# Patient Record
Sex: Female | Born: 1955 | Race: Black or African American | Hispanic: No | Smoking: Never smoker
Health system: Southern US, Community
[De-identification: ages and names within clinical notes are randomized; demographics above are authoritative.]

## PROBLEM LIST (undated history)

## (undated) DIAGNOSIS — N390 Urinary tract infection, site not specified: Secondary | ICD-10-CM

## (undated) DIAGNOSIS — E079 Disorder of thyroid, unspecified: Secondary | ICD-10-CM

## (undated) DIAGNOSIS — E785 Hyperlipidemia, unspecified: Secondary | ICD-10-CM

## (undated) DIAGNOSIS — D649 Anemia, unspecified: Secondary | ICD-10-CM

## (undated) DIAGNOSIS — K8020 Calculus of gallbladder without cholecystitis without obstruction: Secondary | ICD-10-CM

## (undated) DIAGNOSIS — I10 Essential (primary) hypertension: Secondary | ICD-10-CM

## (undated) DIAGNOSIS — E039 Hypothyroidism, unspecified: Secondary | ICD-10-CM

## (undated) DIAGNOSIS — Z87442 Personal history of urinary calculi: Secondary | ICD-10-CM

## (undated) HISTORY — DX: Anemia, unspecified: D64.9

## (undated) HISTORY — DX: Hyperlipidemia, unspecified: E78.5

## (undated) HISTORY — PX: OTHER SURGICAL HISTORY: SHX169

## (undated) HISTORY — DX: Personal history of urinary calculi: Z87.442

## (undated) HISTORY — DX: Disorder of thyroid, unspecified: E07.9

## (undated) HISTORY — DX: Calculus of gallbladder without cholecystitis without obstruction: K80.20

---

## 1999-07-12 ENCOUNTER — Other Ambulatory Visit: Admission: RE | Admit: 1999-07-12 | Discharge: 1999-07-12 | Payer: Self-pay | Admitting: Obstetrics and Gynecology

## 2000-08-02 ENCOUNTER — Other Ambulatory Visit: Admission: RE | Admit: 2000-08-02 | Discharge: 2000-08-02 | Payer: Self-pay | Admitting: Obstetrics and Gynecology

## 2001-09-23 ENCOUNTER — Other Ambulatory Visit: Admission: RE | Admit: 2001-09-23 | Discharge: 2001-09-23 | Payer: Self-pay | Admitting: Obstetrics and Gynecology

## 2002-03-09 ENCOUNTER — Emergency Department (HOSPITAL_COMMUNITY): Admission: EM | Admit: 2002-03-09 | Discharge: 2002-03-09 | Payer: Self-pay | Admitting: *Deleted

## 2002-08-06 ENCOUNTER — Encounter: Payer: Self-pay | Admitting: Internal Medicine

## 2002-08-06 ENCOUNTER — Ambulatory Visit (HOSPITAL_COMMUNITY): Admission: RE | Admit: 2002-08-06 | Discharge: 2002-08-06 | Payer: Self-pay | Admitting: Internal Medicine

## 2002-10-08 ENCOUNTER — Other Ambulatory Visit: Admission: RE | Admit: 2002-10-08 | Discharge: 2002-10-08 | Payer: Self-pay | Admitting: Obstetrics and Gynecology

## 2002-12-08 ENCOUNTER — Encounter: Payer: Self-pay | Admitting: Internal Medicine

## 2002-12-08 ENCOUNTER — Ambulatory Visit (HOSPITAL_COMMUNITY): Admission: RE | Admit: 2002-12-08 | Discharge: 2002-12-08 | Payer: Self-pay | Admitting: Internal Medicine

## 2006-09-02 ENCOUNTER — Other Ambulatory Visit: Admission: RE | Admit: 2006-09-02 | Discharge: 2006-09-02 | Payer: Self-pay | Admitting: Gynecology

## 2007-04-21 ENCOUNTER — Emergency Department (HOSPITAL_COMMUNITY): Admission: EM | Admit: 2007-04-21 | Discharge: 2007-04-21 | Payer: Self-pay | Admitting: Emergency Medicine

## 2007-09-02 ENCOUNTER — Emergency Department (HOSPITAL_COMMUNITY): Admission: EM | Admit: 2007-09-02 | Discharge: 2007-09-02 | Payer: Self-pay | Admitting: Family Medicine

## 2007-10-14 ENCOUNTER — Emergency Department (HOSPITAL_COMMUNITY): Admission: EM | Admit: 2007-10-14 | Discharge: 2007-10-14 | Payer: Self-pay | Admitting: Emergency Medicine

## 2007-11-03 ENCOUNTER — Ambulatory Visit (HOSPITAL_COMMUNITY): Admission: RE | Admit: 2007-11-03 | Discharge: 2007-11-03 | Payer: Self-pay | Admitting: Internal Medicine

## 2008-03-17 ENCOUNTER — Emergency Department (HOSPITAL_COMMUNITY): Admission: EM | Admit: 2008-03-17 | Discharge: 2008-03-17 | Payer: Self-pay | Admitting: Emergency Medicine

## 2011-01-25 LAB — HM PAP SMEAR: HM Pap smear: NORMAL

## 2011-10-10 ENCOUNTER — Emergency Department (HOSPITAL_COMMUNITY)
Admission: EM | Admit: 2011-10-10 | Discharge: 2011-10-10 | Disposition: A | Discharge status: Home / Self Care | Payer: Self-pay | Attending: Emergency Medicine | Admitting: Emergency Medicine

## 2011-10-10 ENCOUNTER — Encounter (HOSPITAL_COMMUNITY): Payer: Self-pay | Admitting: *Deleted

## 2011-10-10 DIAGNOSIS — R300 Dysuria: Secondary | ICD-10-CM

## 2011-10-10 HISTORY — DX: Urinary tract infection, site not specified: N39.0

## 2011-10-10 LAB — URINALYSIS, ROUTINE W REFLEX MICROSCOPIC
Bilirubin Urine: NEGATIVE
Glucose, UA: NEGATIVE mg/dL
Hgb urine dipstick: NEGATIVE
Ketones, ur: NEGATIVE mg/dL
Nitrite: NEGATIVE
Protein, ur: NEGATIVE mg/dL
Specific Gravity, Urine: 1.022 (ref 1.005–1.030)
Urobilinogen, UA: 0.2 mg/dL (ref 0.0–1.0)
pH: 5.5 (ref 5.0–8.0)

## 2011-10-10 LAB — URINE MICROSCOPIC-ADD ON

## 2011-10-10 MED ORDER — PHENAZOPYRIDINE HCL 100 MG PO TABS
200.0000 mg | ORAL_TABLET | Freq: Once | ORAL | Status: AC
  Administered 2011-10-10: 200 mg via ORAL
  Filled 2011-10-10: qty 2

## 2011-10-10 MED ORDER — CIPROFLOXACIN HCL 500 MG PO TABS: 500.0000 mg | ORAL_TABLET | Freq: Two times a day (BID) | ORAL | Status: AC

## 2011-10-10 MED ORDER — PHENAZOPYRIDINE HCL 200 MG PO TABS: 200.0000 mg | ORAL_TABLET | Freq: Three times a day (TID) | ORAL | Status: AC | PRN

## 2011-10-10 NOTE — ED Provider Notes (Signed)
History   This chart was scribed for Marisa Robinson. Oletta Lamas, MD by Clarita Crane. The patient was seen in room STRE2/STRE2. Patient's care was started at 1215.    CSN: [id number]  Arrival date & time 10/10/11  1215   First MD Initiated Contact with Patient 10/10/11 1231      Chief Complaint  Patient presents with  . Dysuria    (Consider location/radiation/quality/duration/timing/severity/associated sxs/prior treatment) HPI Marisa Robinson is a 56 y.o. female who presents to the Emergency Department complaining of multiple episodes of moderate dysuria onset 3 days ago and gradually worsening since with associated urinary frequency. Patient states symptoms are similar to previous UTIs experienced. Denies abdominal pain, fever, vaginal bleeding or discharge, chills, back pain.  Past Medical History  Diagnosis Date  . Urinary tract infection     History reviewed. No pertinent past surgical history.  History reviewed. No pertinent family history.  History  Substance Use Topics  . Smoking status: Never Smoker   . Smokeless tobacco: Not on file  . Alcohol Use: No    OB History    Grav Para Term Preterm Abortions TAB SAB Ect Mult Living                  Review of Systems  Constitutional: Negative for fever, chills and appetite change.  Respiratory: Negative for shortness of breath.   Gastrointestinal: Negative for nausea, vomiting and abdominal pain.  Genitourinary: Positive for dysuria and frequency. Negative for flank pain, vaginal bleeding and vaginal discharge.  Musculoskeletal: Negative for back pain.  Skin: Negative for rash.  Neurological: Negative for weakness.    Allergies  Review of patient's allergies indicates no known allergies.  Home Medications   Current Outpatient Rx  Name Route Sig Dispense Refill  . CIPROFLOXACIN HCL 500 MG PO TABS Oral Take 1 tablet (500 mg total) by mouth 2 (two) times daily. 10 tablet 0  . PHENAZOPYRIDINE HCL 200 MG PO TABS Oral Take 1  tablet (200 mg total) by mouth 3 (three) times daily as needed for pain. 6 tablet 0    BP 157/82  Pulse 71  Temp(Src) 98.1 F (36.7 C) (Oral)  Resp 16  SpO2 100%  Physical Exam  Nursing note and vitals reviewed. Constitutional: She is oriented to person, place, and time. She appears well-developed and well-nourished. No distress.  HENT:  Head: Normocephalic and atraumatic.  Eyes: EOM are normal. No scleral icterus.  Neck: No tracheal deviation present.  Cardiovascular: Normal rate.   Pulmonary/Chest: Effort normal. No respiratory distress.  Abdominal: Soft. She exhibits no distension. There is no tenderness. There is no rebound and no guarding.  Musculoskeletal: Normal range of motion.  Neurological: She is alert and oriented to person, place, and time.  Skin: Skin is warm and dry. No rash noted.  Psychiatric: She has a normal mood and affect. Her behavior is normal.    ED Course  Procedures (including critical care time)  DIAGNOSTIC STUDIES: Oxygen Saturation is 100% on room air, normal by my interpretation.    COORDINATION OF CARE: 12:43PM-Patient informed of current plan for treatment and evaluation and agrees with plan at this time.     Labs Reviewed  URINALYSIS, ROUTINE W REFLEX MICROSCOPIC - Abnormal; Notable for the following:    APPearance CLOUDY (*)    Leukocytes, UA SMALL (*)    All other components within normal limits  URINE MICROSCOPIC-ADD ON - Abnormal; Notable for the following:    Bacteria, UA FEW (*)  Casts HYALINE CASTS (*)    All other components within normal limits  URINE CULTURE   No results found.   1. Dysuria     1:30 PM Few bacteria with only 3-6 WBC's, but small LE suggestive of UTI, but not definitive.  Culture sent, will treat with abx given symptoms, but pt urged to follow up with PCP closely if symptoms are not improved in the next 3-5 days.    MDM  I personally performed the services described in this documentation, which was  scribed in my presence. The recorded information has been reviewed and considered.   Not toxic, symptoms of UTI with urinary freq, urgency, dysuria.  No vaginal symptoms, no abd pain, fever, vomiting.  Denies back pain.  Pyridium given.  UA sent.  Will give fluoroquinolone     Marisa Robinson. Kashonda Sarkisyan, MD 10/10/11 1334

## 2011-10-10 NOTE — Discharge Instructions (Signed)

## 2011-10-10 NOTE — ED Notes (Signed)
Pt reports pain with urination, started on Sunday. No acute distress noted at triage.

## 2012-12-03 ENCOUNTER — Encounter (HOSPITAL_COMMUNITY): Payer: Self-pay | Admitting: Emergency Medicine

## 2012-12-03 ENCOUNTER — Emergency Department (HOSPITAL_COMMUNITY)
Admission: EM | Admit: 2012-12-03 | Discharge: 2012-12-03 | Disposition: A | Discharge status: Home / Self Care | Payer: No Typology Code available for payment source | Attending: Emergency Medicine | Admitting: Emergency Medicine

## 2012-12-03 ENCOUNTER — Emergency Department (HOSPITAL_COMMUNITY): Payer: No Typology Code available for payment source

## 2012-12-03 DIAGNOSIS — Z79899 Other long term (current) drug therapy: Secondary | ICD-10-CM | POA: Insufficient documentation

## 2012-12-03 DIAGNOSIS — R112 Nausea with vomiting, unspecified: Secondary | ICD-10-CM | POA: Insufficient documentation

## 2012-12-03 DIAGNOSIS — D649 Anemia, unspecified: Secondary | ICD-10-CM | POA: Insufficient documentation

## 2012-12-03 DIAGNOSIS — Z87440 Personal history of urinary (tract) infections: Secondary | ICD-10-CM | POA: Insufficient documentation

## 2012-12-03 DIAGNOSIS — K8020 Calculus of gallbladder without cholecystitis without obstruction: Secondary | ICD-10-CM | POA: Insufficient documentation

## 2012-12-03 LAB — LIPASE, BLOOD: Lipase: 47 U/L (ref 11–59)

## 2012-12-03 LAB — CBC WITH DIFFERENTIAL/PLATELET
Basophils Absolute: 0.1 10*3/uL (ref 0.0–0.1)
Basophils Relative: 1 % (ref 0–1)
Eosinophils Absolute: 0.2 10*3/uL (ref 0.0–0.7)
Hemoglobin: 11 g/dL — ABNORMAL LOW (ref 12.0–15.0)
Lymphocytes Relative: 30 % (ref 12–46)
MCHC: 32.6 g/dL (ref 30.0–36.0)
Neutrophils Relative %: 63 % (ref 43–77)
RDW: 14.6 % (ref 11.5–15.5)

## 2012-12-03 LAB — COMPREHENSIVE METABOLIC PANEL
Alkaline Phosphatase: 72 U/L (ref 39–117)
BUN: 19 mg/dL (ref 6–23)
Creatinine, Ser: 1.08 mg/dL (ref 0.50–1.10)
GFR calc Af Amer: 64 mL/min — ABNORMAL LOW (ref >90)
Glucose, Bld: 105 mg/dL — ABNORMAL HIGH (ref 70–99)
Potassium: 3.8 mEq/L (ref 3.5–5.1)
Total Protein: 7.7 g/dL (ref 6.0–8.3)

## 2012-12-03 LAB — URINALYSIS, ROUTINE W REFLEX MICROSCOPIC
Bilirubin Urine: NEGATIVE
Hgb urine dipstick: NEGATIVE
Ketones, ur: NEGATIVE mg/dL
Protein, ur: NEGATIVE mg/dL
Specific Gravity, Urine: 1.016 (ref 1.005–1.030)
Urobilinogen, UA: 0.2 mg/dL (ref 0.0–1.0)

## 2012-12-03 MED ORDER — ONDANSETRON HCL 4 MG/2ML IJ SOLN
4.0000 mg | Freq: Once | INTRAMUSCULAR | Status: AC
  Administered 2012-12-03: 4 mg via INTRAVENOUS
  Filled 2012-12-03: qty 2

## 2012-12-03 MED ORDER — ONDANSETRON HCL 4 MG PO TABS: 4.0000 mg | ORAL_TABLET | Freq: Four times a day (QID) | ORAL | Status: DC

## 2012-12-03 MED ORDER — MORPHINE SULFATE 4 MG/ML IJ SOLN
4.0000 mg | Freq: Once | INTRAMUSCULAR | Status: AC | PRN
  Administered 2012-12-03: 4 mg via INTRAVENOUS
  Filled 2012-12-03 (×2): qty 1

## 2012-12-03 MED ORDER — HYDROCODONE-ACETAMINOPHEN 5-325 MG PO TABS: 1.0000 | ORAL_TABLET | Freq: Three times a day (TID) | ORAL | Status: DC | PRN

## 2012-12-03 MED ORDER — MORPHINE SULFATE 4 MG/ML IJ SOLN
4.0000 mg | Freq: Once | INTRAMUSCULAR | Status: AC
  Administered 2012-12-03: 4 mg via INTRAVENOUS
  Filled 2012-12-03: qty 1

## 2012-12-03 MED ORDER — SODIUM CHLORIDE 0.9 % IV BOLUS (SEPSIS)
1000.0000 mL | Freq: Once | INTRAVENOUS | Status: AC
  Administered 2012-12-03: 1000 mL via INTRAVENOUS

## 2012-12-03 NOTE — ED Notes (Signed)
Pt has a urine cup and knows we need a urine sample 

## 2012-12-03 NOTE — ED Provider Notes (Signed)
History    CSN: [id number] Arrival date & time 12/03/12  5366  First MD Initiated Contact with Patient 12/03/12 [phone]     Chief Complaint  Patient presents with  . Flank Pain   (Consider location/radiation/quality/duration/timing/severity/associated sxs/prior Treatment) HPI  Marisa Robinson is a 57 y.o.female with a significant PMH of UTI presents to the ER with complaints of right flank pain that started 3 days ago and has been worsening with time. She has also had vomiting. The pain is worse in the morning, and continues throughout the day but is less severe. She denies having any lower abdominal pain, fever, vaginal discharge hematuria, injury. Denies history of intraabdominal surgery or history of abdominal pains.    Past Medical History  Diagnosis Date  . Urinary tract infection    History reviewed. No pertinent past surgical history. No family history on file. History  Substance Use Topics  . Smoking status: Never Smoker   . Smokeless tobacco: Not on file  . Alcohol Use: No   OB History   Grav Para Term Preterm Abortions TAB SAB Ect Mult Living                 Review of Systems  Gastrointestinal: Positive for nausea and vomiting.  Genitourinary: Positive for flank pain.  All other systems reviewed and are negative.    Allergies  Review of patient's allergies indicates no known allergies.  Home Medications   Current Outpatient Rx  Name  Route  Sig  Dispense  Refill  . Multiple Vitamin (MULTIVITAMIN WITH MINERALS) TABS   Oral   Take 1 tablet by mouth daily.         . Probiotic Product (PROBIOTIC FORMULA PO)   Oral   Take 1 capsule by mouth daily. 10X by [facility]          BP 161/86  Pulse 63  Temp(Src) 98.6 F (37 C) (Oral)  Resp 18  SpO2 96% Physical Exam  Nursing note and vitals reviewed. Constitutional: She appears well-developed and well-nourished. No distress.  HENT:  Head: Normocephalic and atraumatic.  Eyes: Pupils are equal,  round, and reactive to light.  Neck: Normal range of motion. Neck supple.  Cardiovascular: Normal rate and regular rhythm.   Pulmonary/Chest: Effort normal.  Abdominal: Soft. Bowel sounds are normal. There is tenderness. There is CVA tenderness. There is no rigidity, no guarding, no tenderness at McBurney's point and negative Murphy's sign.  Neurological: She is alert.  Skin: Skin is warm and dry.    ED Course  Procedures (including critical care time) Labs Reviewed  COMPREHENSIVE METABOLIC PANEL - Abnormal; Notable for the following:    Glucose, Bld 105 (*)    Total Bilirubin 0.2 (*)    GFR calc non Af Amer 55 (*)    GFR calc Af Amer 64 (*)    All other components within normal limits  CBC WITH DIFFERENTIAL - Abnormal; Notable for the following:    Hemoglobin 11.0 (*)    HCT 33.7 (*)    MCV 74.2 (*)    MCH 24.2 (*)    All other components within normal limits  URINALYSIS, ROUTINE W REFLEX MICROSCOPIC  LIPASE, BLOOD   No results found. No diagnosis found.  MDM  Patient has required another dose of pain medication and pain has localized RUQ. Labs are benign. Will order abdominal ultrasound to evaluate right upper quadrant.   End of shift care handed over to [place], PA-C at end of shift.  Dorthula Matas, PA-C 12/03/12 1304

## 2012-12-03 NOTE — ED Provider Notes (Signed)
Medical screening examination/treatment/procedure(s) were performed by non-physician practitioner and as supervising physician I was immediately available for consultation/collaboration.   Gwyneth Sprout, MD 12/03/12 1538

## 2012-12-03 NOTE — ED Provider Notes (Signed)
Tranfer of care from Marlon Pel, PA-C at 1:30PM change of shifts.  Marisa Robinson is a 57 y/o F presenting to the ED with right flank pain and RUQ pain that has been ongoing for the past 3 days with intermittent vomiting at least 2-3 times per day, mainly of food contents. Denied nausea, diarrhea, changes to appetite, fever, chills, headache. Denied history of kidney stones and gallstones.   UA negative findings - negative calcium oxalate crystals. CMP negative findings. Lipase negative elevation. CBC mildly low Hgb (11.0) and Hct (33.7) .  Abdominal US ordered - ruling out gallstones and kidney stones.  Abdominal US - negative kidney stones - cholelithiasis with gallstones - negative cholecystitis findings.   Negative elevation in WBC, patient afebrile, gallstones noted in Abdominal US without gallbladder wall thickening and pericholecystic fluid - doubt cholecystitis at the moment.  Patient re-assessed at 3:00PM - patient reported that pain is controlled, negative pain noted upon palpation of the abdomen. Abdomen soft, non-distended, non-tender - negative Murphy's sign noted. Patient stable afebrile. Cholelithiasis with gallstones, negative cholecystitis noted at the time. Discharged patient with pain medications and anti-emetics - discussed course, precautions and technique while on pain medications. Discharged patient with referral to general surgery and adult care clinic. Discussed with patient to get Hgb, Hct, glucose, and cholesterol levels checked when at the clinic. Discussed with patients symptoms to watch out for regarding cholecystitis. Discussed with patient diet. Discussed with patient to continue to monitor symptoms and if symptoms are to worsen or change to report back to the ED - strict return instructions given.  Patient agreed to plan of care, understood, all questions answered.     Raymon Mutton, PA-C 12/03/12 1630

## 2012-12-03 NOTE — ED Notes (Signed)
PT. REPORTS RIGHT FLANK PAIN WITH EMESIS ONSET 3 DAYS AGO , DENIES DYSURIA OR HEMATURIA , NO INJURY , AMBULATORY.

## 2012-12-04 NOTE — ED Provider Notes (Signed)
Medical screening examination/treatment/procedure(s) were performed by non-physician practitioner and as supervising physician I was immediately available for consultation/collaboration.   Gwyneth Sprout, MD 12/04/12 1530

## 2012-12-11 ENCOUNTER — Ambulatory Visit (INDEPENDENT_AMBULATORY_CARE_PROVIDER_SITE_OTHER): Payer: No Typology Code available for payment source | Admitting: Surgery

## 2012-12-11 ENCOUNTER — Telehealth (INDEPENDENT_AMBULATORY_CARE_PROVIDER_SITE_OTHER): Payer: Self-pay | Admitting: Surgery

## 2012-12-11 ENCOUNTER — Encounter (INDEPENDENT_AMBULATORY_CARE_PROVIDER_SITE_OTHER): Payer: Self-pay | Admitting: Surgery

## 2012-12-11 VITALS — BP 120/80 | HR 62 | Temp 98.8°F | Resp 14 | Ht 67.0 in | Wt 217.0 lb

## 2012-12-11 DIAGNOSIS — K8020 Calculus of gallbladder without cholecystitis without obstruction: Secondary | ICD-10-CM | POA: Insufficient documentation

## 2012-12-11 DIAGNOSIS — K8010 Calculus of gallbladder with chronic cholecystitis without obstruction: Secondary | ICD-10-CM

## 2012-12-11 NOTE — Telephone Encounter (Signed)
Patient advised of financial responsibility for surgery, per patient will call back to schedule

## 2012-12-11 NOTE — Patient Instructions (Signed)
  [facility]  LAPAROSCOPIC SURGERY - POST-OP INSTRUCTIONS  Always review your discharge instruction sheet given to you by the facility where your surgery was performed.  A prescription for pain medication may be given to you upon discharge.  Take your pain medication as prescribed.  If narcotic pain medicine is not needed, then you may take acetaminophen (Tylenol) or ibuprofen (Advil) as needed.  Take your usually prescribed medications unless otherwise directed.  If you need a refill on your pain medication, please contact your pharmacy.  They will contact our office to request authorization. Prescriptions will not be filled after 5 P.M. or on weekends.  You should follow a light diet the first few days after arrival home, such as soup and crackers or toast.  Be sure to include plenty of fluids daily.  Most patients will experience some swelling and bruising in the area of the incisions.  Ice packs will help.  Swelling and bruising can take several days to resolve.   It is common to experience some constipation if taking pain medication after surgery.  Increasing fluid intake and taking a stool softener (such as Colace) will usually help or prevent this problem from occurring.  A mild laxative (Milk of Magnesia or Miralax) should be taken according to package instructions if there are no bowel movements after 48 hours.  Unless discharge instructions indicate otherwise, you may remove your bandages 24-48 hours after surgery, and you may shower at that time.  You may have steri-strips (small skin tapes) in place directly over the incision.  These strips should be left on the skin for 7-10 days.  If your surgeon used skin glue on the incision, you may shower in 24 hours.  The glue will flake off over the next 2-3 weeks.  Any sutures or staples will be removed at the office during your follow-up visit.  ACTIVITIES:  You may resume regular (light) daily activities beginning the  next day-such as daily self-care, walking, climbing stairs-gradually increasing activities as tolerated.  You may have sexual intercourse when it is comfortable.  Refrain from any heavy lifting or straining until approved by your doctor.  You may drive when you are no longer taking prescription pain medication, you can comfortably wear a seatbelt, and you can safely maneuver your car and apply brakes.  You should see your doctor in the office for a follow-up appointment approximately 2-3 weeks after your surgery.  Make sure that you call for this appointment within a day or two after you arrive home to insure a convenient appointment time.  WHEN TO CALL YOUR DOCTOR: 1. Fever over 101.0 2. Inability to urinate 3. Continued bleeding from incision 4. Increased pain, redness, or drainage from the incision 5. Increasing abdominal pain  The clinic staff is available to answer your questions during regular business hours.  Please don't hesitate to call and ask to speak to one of the nurses for clinical concerns.  If you have a medical emergency, go to the nearest emergency room or call 911.  A surgeon from [facility] is always on call for the hospital.  Sydne Krahl M. Keziah Drotar, MD, FACS [facility] Office: [phone] Toll Free:  [phone] FAX [phone]  Web site: [email or web address] 

## 2012-12-11 NOTE — Progress Notes (Signed)
General Surgery [facility]  Chief Complaint  Patient presents with  . New Evaluation    evaluate for cholecystectomy - referral from Dr. Gwyneth Sprout, [facility]   HISTORY: Patient is a 57 year old female referred from the emergency department with a diagnosis of symptomatic cholelithiasis. Patient was seen in the emergency department for abdominal pain radiating to the right flank. This was associated with nausea and vomiting. Laboratory studies show normal white blood cell count. Patient underwent abdominal ultrasound which confirmed multiple small gallstones and borderline dilated common bile duct. Patient was treated and released. She was referred to general surgery for consideration for cholecystectomy.  Patient has had no prior episodes of abdominal pain of this nature. She denies down to surgery colic stools. There is no family history of biliary disease. Patient has had no prior episodes of hepatitis or pancreatitis. Her only prior abdominal surgery was for ectopic pregnancy.  Past Medical History  Diagnosis Date  . Urinary tract infection   . Anemia   . Thyroid disease      Current Outpatient Prescriptions  Medication Sig Dispense Refill  . Coconut Oil 1000 MG CAPS Take by mouth.      . Misc Natural Products (OSTEO BI-FLEX ADV DOUBLE ST PO) Take by mouth.      . Multiple Vitamin (MULTIVITAMIN WITH MINERALS) TABS Take 1 tablet by mouth daily.      . Probiotic Product (PROBIOTIC FORMULA PO) Take 1 capsule by mouth daily. 10X by [facility]      . HYDROcodone-acetaminophen (NORCO) 5-325 MG per tablet Take 1 tablet by mouth every 8 (eight) hours as needed for pain.  11 tablet  0  . ondansetron (ZOFRAN) 4 MG tablet Take 1 tablet (4 mg total) by mouth every 6 (six) hours.  12 tablet  0   No current facility-administered medications for this visit.     No Known Allergies   Family History  Problem Relation Age of Onset  . Diabetes Father       History   Social History  . Marital Status: Divorced    Spouse Name: N/A    Number of Children: N/A  . Years of Education: N/A   Social History Main Topics  . Smoking status: Never Smoker   . Smokeless tobacco: None  . Alcohol Use: Yes     Comment: social  . Drug Use: No  . Sexually Active: None   Other Topics Concern  . None   Social History Narrative  . None     REVIEW OF SYSTEMS - PERTINENT POSITIVES ONLY: Denies jaundice. Denies acholic stools. No history of hepatobiliary or pancreatic disease. Occasional constipation.  EXAM: Filed Vitals:   12/11/12 0854  BP: 120/80  Pulse: 62  Temp: 98.8 F (37.1 C)  Resp: 14    HEENT: normocephalic; pupils equal and reactive; sclerae clear; dentition good; mucous membranes moist NECK:  No palpable thyroid nodules; symmetric on extension; no palpable anterior or posterior cervical lymphadenopathy; no supraclavicular masses; no tenderness CHEST: clear to auscultation bilaterally without rales, rhonchi, or wheezes CARDIAC: regular rate and rhythm without significant murmur; peripheral pulses are full ABDOMEN: soft without distension; bowel sounds present; no mass; no hepatosplenomegaly; no hernia; Well-healed incision low abdomen EXT:  non-tender without edema; no deformity NEURO: no gross focal deficits; no sign of tremor   LABORATORY RESULTS: See [facility] (CHL-Epic) for most recent results   RADIOLOGY RESULTS: See [facility] (CHL-Epic) for most recent results   IMPRESSION:  Symptomatic cholelithiasis, rule out common bile duct stone  PLAN: The patient and I discussed the findings from her emergency department visit. We reviewed her ultrasound results. We discussed laparoscopic cholecystectomy with intraoperative cholangiography. We discussed the procedure, potential complications, anticipated hospital stay, and postoperative recovery. She understands and wishes to proceed. We will make arrangements  for surgery in the near future at a time convenient for the patient.  The risks and benefits of the procedure have been discussed at length with the patient.  The patient understands the proposed procedure, potential alternative treatments, and the course of recovery to be expected.  All of the patient's questions have been answered at this time.  The patient wishes to proceed with surgery.  Velora Heckler, MD, FACS General & Endocrine Surgery [facility]   Visit Diagnoses: 1. Cholelithiasis with cholecystitis     Primary Care Physician: No PCP Per Patient

## 2013-02-12 ENCOUNTER — Other Ambulatory Visit (INDEPENDENT_AMBULATORY_CARE_PROVIDER_SITE_OTHER): Payer: No Typology Code available for payment source

## 2013-02-12 ENCOUNTER — Encounter: Payer: Self-pay | Admitting: Internal Medicine

## 2013-02-12 ENCOUNTER — Ambulatory Visit (INDEPENDENT_AMBULATORY_CARE_PROVIDER_SITE_OTHER): Payer: No Typology Code available for payment source | Admitting: Internal Medicine

## 2013-02-12 VITALS — BP 148/98 | HR 58 | Temp 98.1°F | Resp 16 | Ht 66.5 in | Wt 223.1 lb

## 2013-02-12 DIAGNOSIS — D649 Anemia, unspecified: Secondary | ICD-10-CM

## 2013-02-12 DIAGNOSIS — R7309 Other abnormal glucose: Secondary | ICD-10-CM

## 2013-02-12 DIAGNOSIS — E785 Hyperlipidemia, unspecified: Secondary | ICD-10-CM

## 2013-02-12 DIAGNOSIS — Z1231 Encounter for screening mammogram for malignant neoplasm of breast: Secondary | ICD-10-CM

## 2013-02-12 DIAGNOSIS — Z0000 Encounter for general adult medical examination without abnormal findings: Secondary | ICD-10-CM

## 2013-02-12 DIAGNOSIS — E119 Type 2 diabetes mellitus without complications: Secondary | ICD-10-CM | POA: Insufficient documentation

## 2013-02-12 DIAGNOSIS — K8010 Calculus of gallbladder with chronic cholecystitis without obstruction: Secondary | ICD-10-CM

## 2013-02-12 DIAGNOSIS — I10 Essential (primary) hypertension: Secondary | ICD-10-CM

## 2013-02-12 LAB — LDL CHOLESTEROL, DIRECT: Direct LDL: 215.6 mg/dL

## 2013-02-12 LAB — CBC WITH DIFFERENTIAL/PLATELET
Basophils Absolute: 0.1 10*3/uL (ref 0.0–0.1)
Eosinophils Absolute: 0.2 10*3/uL (ref 0.0–0.7)
Hemoglobin: 11.2 g/dL — ABNORMAL LOW (ref 12.0–15.0)
Lymphocytes Relative: 39.9 % (ref 12.0–46.0)
MCHC: 32.7 g/dL (ref 30.0–36.0)
Monocytes Relative: 4.5 % (ref 3.0–12.0)
Neutrophils Relative %: 49.9 % (ref 43.0–77.0)
Platelets: 291 10*3/uL (ref 150.0–400.0)
RDW: 14.1 % (ref 11.5–14.6)

## 2013-02-12 LAB — URINALYSIS, ROUTINE W REFLEX MICROSCOPIC
Ketones, ur: NEGATIVE
Specific Gravity, Urine: 1.025 (ref 1.000–1.030)
Total Protein, Urine: NEGATIVE
Urine Glucose: NEGATIVE
pH: 5.5 (ref 5.0–8.0)

## 2013-02-12 LAB — COMPREHENSIVE METABOLIC PANEL
ALT: 17 U/L (ref 0–35)
AST: 19 U/L (ref 0–37)
Albumin: 4.1 g/dL (ref 3.5–5.2)
CO2: 29 mEq/L (ref 19–32)
Calcium: 9.2 mg/dL (ref 8.4–10.5)
Chloride: 103 mEq/L (ref 96–112)
Creatinine, Ser: 0.9 mg/dL (ref 0.4–1.2)
GFR: 80.47 mL/min (ref >60.00)
Potassium: 4.1 mEq/L (ref 3.5–5.1)
Sodium: 137 mEq/L (ref 135–145)
Total Protein: 7.5 g/dL (ref 6.0–8.3)

## 2013-02-12 LAB — LIPID PANEL
HDL: 42.4 mg/dL (ref >39.00)
Triglycerides: 179 mg/dL — ABNORMAL HIGH (ref 0.0–149.0)

## 2013-02-12 LAB — IBC PANEL
Saturation Ratios: 21.7 % (ref 20.0–50.0)
Transferrin: 230.6 mg/dL (ref 212.0–360.0)

## 2013-02-12 LAB — FECAL OCCULT BLOOD, GUAIAC: Fecal Occult Blood: NEGATIVE

## 2013-02-12 LAB — FERRITIN: Ferritin: 133.7 ng/mL (ref 10.0–291.0)

## 2013-02-12 MED ORDER — EZETIMIBE 10 MG PO TABS: 10.0000 mg | ORAL_TABLET | Freq: Every day | ORAL | Status: DC

## 2013-02-12 MED ORDER — OLMESARTAN MEDOXOMIL-HCTZ 20-12.5 MG PO TABS: 1.0000 | ORAL_TABLET | Freq: Every day | ORAL | Status: DC

## 2013-02-12 MED ORDER — ATORVASTATIN CALCIUM 80 MG PO TABS: 80.0000 mg | ORAL_TABLET | Freq: Every day | ORAL | Status: DC

## 2013-02-12 NOTE — Patient Instructions (Signed)
Preventive Care for Adults, Female A healthy lifestyle and preventive care can promote health and wellness. Preventive health guidelines for women include the following key practices.  A routine yearly physical is a good way to check with your caregiver about your health and preventive screening. It is a chance to share any concerns and updates on your health, and to receive a thorough exam.  Visit your dentist for a routine exam and preventive care every 6 months. Brush your teeth twice a day and floss once a day. Good oral hygiene prevents tooth decay and gum disease.  The frequency of eye exams is based on your age, health, family medical history, use of contact lenses, and other factors. Follow your caregiver's recommendations for frequency of eye exams.  Eat a healthy diet. Foods like vegetables, fruits, whole grains, low-fat dairy products, and lean protein foods contain the nutrients you need without too many calories. Decrease your intake of foods high in solid fats, added sugars, and salt. Eat the right amount of calories for you.Get information about a proper diet from your caregiver, if necessary.  Regular physical exercise is one of the most important things you can do for your health. Most adults should get at least 150 minutes of moderate-intensity exercise (any activity that increases your heart rate and causes you to sweat) each week. In addition, most adults need muscle-strengthening exercises on 2 or more days a week.  Maintain a healthy weight. The body mass index (BMI) is a screening tool to identify possible weight problems. It provides an estimate of body fat based on height and weight. Your caregiver can help determine your BMI, and can help you achieve or maintain a healthy weight.For adults 20 years and older:  A BMI below 18.5 is considered underweight.  A BMI of 18.5 to 24.9 is normal.  A BMI of 25 to 29.9 is considered overweight.  A BMI of 30 and above is  considered obese.  Maintain normal blood lipids and cholesterol levels by exercising and minimizing your intake of saturated fat. Eat a balanced diet with plenty of fruit and vegetables. Blood tests for lipids and cholesterol should begin at age 20 and be repeated every 5 years. If your lipid or cholesterol levels are high, you are over 50, or you are at high risk for heart disease, you may need your cholesterol levels checked more frequently.Ongoing high lipid and cholesterol levels should be treated with medicines if diet and exercise are not effective.  If you smoke, find out from your caregiver how to quit. If you do not use tobacco, do not start.  If you are pregnant, do not drink alcohol. If you are breastfeeding, be very cautious about drinking alcohol. If you are not pregnant and choose to drink alcohol, do not exceed 1 drink per day. One drink is considered to be 12 ounces (355 mL) of beer, 5 ounces (148 mL) of wine, or 1.5 ounces (44 mL) of liquor.  Avoid use of street drugs. Do not share needles with anyone. Ask for help if you need support or instructions about stopping the use of drugs.  High blood pressure causes heart disease and increases the risk of stroke. Your blood pressure should be checked at least every 1 to 2 years. Ongoing high blood pressure should be treated with medicines if weight loss and exercise are not effective.  If you are 55 to 58 years old, ask your caregiver if you should take aspirin to prevent strokes.  Diabetes   screening involves taking a blood sample to check your fasting blood sugar level. This should be done once every 3 years, after age 45, if you are within normal weight and without risk factors for diabetes. Testing should be considered at a younger age or be carried out more frequently if you are overweight and have at least 1 risk factor for diabetes.  Breast cancer screening is essential preventive care for women. You should practice "breast  self-awareness." This means understanding the normal appearance and feel of your breasts and may include breast self-examination. Any changes detected, no matter how small, should be reported to a caregiver. Women in their 20s and 30s should have a clinical breast exam (CBE) by a caregiver as part of a regular health exam every 1 to 3 years. After age 40, women should have a CBE every year. Starting at age 40, women should consider having a mammography (breast X-ray test) every year. Women who have a family history of breast cancer should talk to their caregiver about genetic screening. Women at a high risk of breast cancer should talk to their caregivers about having magnetic resonance imaging (MRI) and a mammography every year.  The Pap test is a screening test for cervical cancer. A Pap test can show cell changes on the cervix that might become cervical cancer if left untreated. A Pap test is a procedure in which cells are obtained and examined from the lower end of the uterus (cervix).  Women should have a Pap test starting at age 21.  Between ages 21 and 29, Pap tests should be repeated every 2 years.  Beginning at age 30, you should have a Pap test every 3 years as long as the past 3 Pap tests have been normal.  Some women have medical problems that increase the chance of getting cervical cancer. Talk to your caregiver about these problems. It is especially important to talk to your caregiver if a new problem develops soon after your last Pap test. In these cases, your caregiver may recommend more frequent screening and Pap tests.  The above recommendations are the same for women who have or have not gotten the vaccine for human papillomavirus (HPV).  If you had a hysterectomy for a problem that was not cancer or a condition that could lead to cancer, then you no longer need Pap tests. Even if you no longer need a Pap test, a regular exam is a good idea to make sure no other problems are  starting.  If you are between ages 65 and 70, and you have had normal Pap tests going back 10 years, you no longer need Pap tests. Even if you no longer need a Pap test, a regular exam is a good idea to make sure no other problems are starting.  If you have had past treatment for cervical cancer or a condition that could lead to cancer, you need Pap tests and screening for cancer for at least 20 years after your treatment.  If Pap tests have been discontinued, risk factors (such as a new sexual partner) need to be reassessed to determine if screening should be resumed.  The HPV test is an additional test that may be used for cervical cancer screening. The HPV test looks for the virus that can cause the cell changes on the cervix. The cells collected during the Pap test can be tested for HPV. The HPV test could be used to screen women aged 30 years and older, and should   be used in women of any age who have unclear Pap test results. After the age of 30, women should have HPV testing at the same frequency as a Pap test.  Colorectal cancer can be detected and often prevented. Most routine colorectal cancer screening begins at the age of 50 and continues through age 75. However, your caregiver may recommend screening at an earlier age if you have risk factors for colon cancer. On a yearly basis, your caregiver may provide home test kits to check for hidden blood in the stool. Use of a small camera at the end of a tube, to directly examine the colon (sigmoidoscopy or colonoscopy), can detect the earliest forms of colorectal cancer. Talk to your caregiver about this at age 50, when routine screening begins. Direct examination of the colon should be repeated every 5 to 10 years through age 75, unless early forms of pre-cancerous polyps or small growths are found.  Hepatitis C blood testing is recommended for all people born from 1945 through 1965 and any individual with known risks for hepatitis C.  Practice  safe sex. Use condoms and avoid high-risk sexual practices to reduce the spread of sexually transmitted infections (STIs). STIs include gonorrhea, chlamydia, syphilis, trichomonas, herpes, HPV, and human immunodeficiency virus (HIV). Herpes, HIV, and HPV are viral illnesses that have no cure. They can result in disability, cancer, and death. Sexually active women aged 25 and younger should be checked for chlamydia. Older women with new or multiple partners should also be tested for chlamydia. Testing for other STIs is recommended if you are sexually active and at increased risk.  Osteoporosis is a disease in which the bones lose minerals and strength with aging. This can result in serious bone fractures. The risk of osteoporosis can be identified using a bone density scan. Women ages 65 and over and women at risk for fractures or osteoporosis should discuss screening with their caregivers. Ask your caregiver whether you should take a calcium supplement or vitamin D to reduce the rate of osteoporosis.  Menopause can be associated with physical symptoms and risks. Hormone replacement therapy is available to decrease symptoms and risks. You should talk to your caregiver about whether hormone replacement therapy is right for you.  Use sunscreen with sun protection factor (SPF) of 30 or more. Apply sunscreen liberally and repeatedly throughout the day. You should seek shade when your shadow is shorter than you. Protect yourself by wearing long sleeves, pants, a wide-brimmed hat, and sunglasses year round, whenever you are outdoors.  Once a month, do a whole body skin exam, using a mirror to look at the skin on your back. Notify your caregiver of new moles, moles that have irregular borders, moles that are larger than a pencil eraser, or moles that have changed in shape or color.  Stay current with required immunizations.  Influenza. You need a dose every fall (or winter). The composition of the flu vaccine  changes each year, so being vaccinated once is not enough.  Pneumococcal polysaccharide. You need 1 to 2 doses if you smoke cigarettes or if you have certain chronic medical conditions. You need 1 dose at age 65 (or older) if you have never been vaccinated.  Tetanus, diphtheria, pertussis (Tdap, Td). Get 1 dose of Tdap vaccine if you are younger than age 65, are over 65 and have contact with an infant, are a healthcare worker, are pregnant, or simply want to be protected from whooping cough. After that, you need a Td   booster dose every 10 years. Consult your caregiver if you have not had at least 3 tetanus and diphtheria-containing shots sometime in your life or have a deep or dirty wound.  HPV. You need this vaccine if you are a woman age 26 or younger. The vaccine is given in 3 doses over 6 months.  Measles, mumps, rubella (MMR). You need at least 1 dose of MMR if you were born in 1957 or later. You may also need a second dose.  Meningococcal. If you are age 19 to 21 and a first-year college student living in a residence hall, or have one of several medical conditions, you need to get vaccinated against meningococcal disease. You may also need additional booster doses.  Zoster (shingles). If you are age 60 or older, you should get this vaccine.  Varicella (chickenpox). If you have never had chickenpox or you were vaccinated but received only 1 dose, talk to your caregiver to find out if you need this vaccine.  Hepatitis A. You need this vaccine if you have a specific risk factor for hepatitis A virus infection or you simply wish to be protected from this disease. The vaccine is usually given as 2 doses, 6 to 18 months apart.  Hepatitis B. You need this vaccine if you have a specific risk factor for hepatitis B virus infection or you simply wish to be protected from this disease. The vaccine is given in 3 doses, usually over 6 months. Preventive Services / Frequency Ages 19 to 39  Blood  pressure check.** / Every 1 to 2 years.  Lipid and cholesterol check.** / Every 5 years beginning at age 20.  Clinical breast exam.** / Every 3 years for women in their 20s and 30s.  Pap test.** / Every 2 years from ages 21 through 29. Every 3 years starting at age 30 through age 65 or 70 with a history of 3 consecutive normal Pap tests.  HPV screening.** / Every 3 years from ages 30 through ages 65 to 70 with a history of 3 consecutive normal Pap tests.  Hepatitis C blood test.** / For any individual with known risks for hepatitis C.  Skin self-exam. / Monthly.  Influenza immunization.** / Every year.  Pneumococcal polysaccharide immunization.** / 1 to 2 doses if you smoke cigarettes or if you have certain chronic medical conditions.  Tetanus, diphtheria, pertussis (Tdap, Td) immunization. / A one-time dose of Tdap vaccine. After that, you need a Td booster dose every 10 years.  HPV immunization. / 3 doses over 6 months, if you are 26 and younger.  Measles, mumps, rubella (MMR) immunization. / You need at least 1 dose of MMR if you were born in 1957 or later. You may also need a second dose.  Meningococcal immunization. / 1 dose if you are age 19 to 21 and a first-year college student living in a residence hall, or have one of several medical conditions, you need to get vaccinated against meningococcal disease. You may also need additional booster doses.  Varicella immunization.** / Consult your caregiver.  Hepatitis A immunization.** / Consult your caregiver. 2 doses, 6 to 18 months apart.  Hepatitis B immunization.** / Consult your caregiver. 3 doses usually over 6 months. Ages 40 to 64  Blood pressure check.** / Every 1 to 2 years.  Lipid and cholesterol check.** / Every 5 years beginning at age 20.  Clinical breast exam.** / Every year after age 40.  Mammogram.** / Every year beginning at age 40   and continuing for as long as you are in good health. Consult with your  caregiver.  Pap test.** / Every 3 years starting at age 30 through age 65 or 70 with a history of 3 consecutive normal Pap tests.  HPV screening.** / Every 3 years from ages 30 through ages 65 to 70 with a history of 3 consecutive normal Pap tests.  Fecal occult blood test (FOBT) of stool. / Every year beginning at age 50 and continuing until age 75. You may not need to do this test if you get a colonoscopy every 10 years.  Flexible sigmoidoscopy or colonoscopy.** / Every 5 years for a flexible sigmoidoscopy or every 10 years for a colonoscopy beginning at age 50 and continuing until age 75.  Hepatitis C blood test.** / For all people born from 1945 through 1965 and any individual with known risks for hepatitis C.  Skin self-exam. / Monthly.  Influenza immunization.** / Every year.  Pneumococcal polysaccharide immunization.** / 1 to 2 doses if you smoke cigarettes or if you have certain chronic medical conditions.  Tetanus, diphtheria, pertussis (Tdap, Td) immunization.** / A one-time dose of Tdap vaccine. After that, you need a Td booster dose every 10 years.  Measles, mumps, rubella (MMR) immunization. / You need at least 1 dose of MMR if you were born in 1957 or later. You may also need a second dose.  Varicella immunization.** / Consult your caregiver.  Meningococcal immunization.** / Consult your caregiver.  Hepatitis A immunization.** / Consult your caregiver. 2 doses, 6 to 18 months apart.  Hepatitis B immunization.** / Consult your caregiver. 3 doses, usually over 6 months. Ages 65 and over  Blood pressure check.** / Every 1 to 2 years.  Lipid and cholesterol check.** / Every 5 years beginning at age 20.  Clinical breast exam.** / Every year after age 40.  Mammogram.** / Every year beginning at age 40 and continuing for as long as you are in good health. Consult with your caregiver.  Pap test.** / Every 3 years starting at age 30 through age 65 or 70 with a 3  consecutive normal Pap tests. Testing can be stopped between 65 and 70 with 3 consecutive normal Pap tests and no abnormal Pap or HPV tests in the past 10 years.  HPV screening.** / Every 3 years from ages 30 through ages 65 or 70 with a history of 3 consecutive normal Pap tests. Testing can be stopped between 65 and 70 with 3 consecutive normal Pap tests and no abnormal Pap or HPV tests in the past 10 years.  Fecal occult blood test (FOBT) of stool. / Every year beginning at age 50 and continuing until age 75. You may not need to do this test if you get a colonoscopy every 10 years.  Flexible sigmoidoscopy or colonoscopy.** / Every 5 years for a flexible sigmoidoscopy or every 10 years for a colonoscopy beginning at age 50 and continuing until age 75.  Hepatitis C blood test.** / For all people born from 1945 through 1965 and any individual with known risks for hepatitis C.  Osteoporosis screening.** / A one-time screening for women ages 65 and over and women at risk for fractures or osteoporosis.  Skin self-exam. / Monthly.  Influenza immunization.** / Every year.  Pneumococcal polysaccharide immunization.** / 1 dose at age 65 (or older) if you have never been vaccinated.  Tetanus, diphtheria, pertussis (Tdap, Td) immunization. / A one-time dose of Tdap vaccine if you are over   65 and have contact with an infant, are a healthcare worker, or simply want to be protected from whooping cough. After that, you need a Td booster dose every 10 years.  Varicella immunization.** / Consult your caregiver.  Meningococcal immunization.** / Consult your caregiver.  Hepatitis A immunization.** / Consult your caregiver. 2 doses, 6 to 18 months apart.  Hepatitis B immunization.** / Check with your caregiver. 3 doses, usually over 6 months. ** Family history and personal history of risk and conditions may change your caregiver's recommendations. Document Released: 07/17/2001 Document Revised: 08/13/2011  Document Reviewed: 10/16/2010 [employer] Patient Information 2014 [employer]. Hypertension As your heart beats, it forces blood through your arteries. This force is your blood pressure. If the pressure is too high, it is called hypertension (HTN) or high blood pressure. HTN is dangerous because you may have it and not know it. High blood pressure may mean that your heart has to work harder to pump blood. Your arteries may be narrow or stiff. The extra work puts you at risk for heart disease, stroke, and other problems.  Blood pressure consists of two numbers, a higher number over a lower, 110/72, for example. It is stated as "110 over 72." The ideal is below 120 for the top number (systolic) and under 80 for the bottom (diastolic). Write down your blood pressure today. You should pay close attention to your blood pressure if you have certain conditions such as:  Heart failure.  Prior heart attack.  Diabetes  Chronic kidney disease.  Prior stroke.  Multiple risk factors for heart disease. To see if you have HTN, your blood pressure should be measured while you are seated with your arm held at the level of the heart. It should be measured at least twice. A one-time elevated blood pressure reading (especially in the Emergency Department) does not mean that you need treatment. There may be conditions in which the blood pressure is different between your right and left arms. It is important to see your caregiver soon for a recheck. Most people have essential hypertension which means that there is not a specific cause. This type of high blood pressure may be lowered by changing lifestyle factors such as:  Stress.  Smoking.  Lack of exercise.  Excessive weight.  Drug/tobacco/alcohol use.  Eating less salt. Most people do not have symptoms from high blood pressure until it has caused damage to the body. Effective treatment can often prevent, delay or reduce that damage. TREATMENT  When a  cause has been identified, treatment for high blood pressure is directed at the cause. There are a large number of medications to treat HTN. These fall into several categories, and your caregiver will help you select the medicines that are best for you. Medications may have side effects. You should review side effects with your caregiver. If your blood pressure stays high after you have made lifestyle changes or started on medicines,   Your medication(s) may need to be changed.  Other problems may need to be addressed.  Be certain you understand your prescriptions, and know how and when to take your medicine.  Be sure to follow up with your caregiver within the time frame advised (usually within two weeks) to have your blood pressure rechecked and to review your medications.  If you are taking more than one medicine to lower your blood pressure, make sure you know how and at what times they should be taken. Taking two medicines at the same time can result   in blood pressure that is too low. SEEK IMMEDIATE MEDICAL CARE IF:  You develop a severe headache, blurred or changing vision, or confusion.  You have unusual weakness or numbness, or a faint feeling.  You have severe chest or abdominal pain, vomiting, or breathing problems. MAKE SURE YOU:   Understand these instructions.  Will watch your condition.  Will get help right away if you are not doing well or get worse. Document Released: 05/21/2005 Document Revised: 08/13/2011 Document Reviewed: 01/09/2008 [employer] Patient Information 2014 [employer].  

## 2013-02-12 NOTE — Assessment & Plan Note (Signed)
Exam done Vaccines were updated Labs ordered She was referred for a mammo and colonoscopy Pt ed material was given

## 2013-02-12 NOTE — Assessment & Plan Note (Signed)
I have asked her to start zetia and lipitor to treat this

## 2013-02-12 NOTE — Assessment & Plan Note (Signed)
She will work on her lifestyle modifications 

## 2013-02-12 NOTE — Assessment & Plan Note (Signed)
I will check her labs to look for secondary causes of HTN and to lok for end organ damage She will work on her lifestyle modifications and will start Benicar-HCT to lower the BP

## 2013-02-12 NOTE — Progress Notes (Signed)
Subjective:    Patient ID: Marisa Robinson, female    DOB: 1956-05-17, 57 y.o.   MRN: [id number]  Hypertension This is a new problem. The current episode started more than 1 month ago. The problem has been gradually worsening since onset. The problem is uncontrolled. Pertinent negatives include no anxiety, blurred vision, chest pain, headaches, malaise/fatigue, neck pain, orthopnea, palpitations, peripheral edema, PND, shortness of breath or sweats. Past treatments include nothing. The current treatment provides no improvement. Compliance problems include exercise and diet.       Review of Systems  Constitutional: Negative.  Negative for malaise/fatigue.  HENT: Negative.  Negative for neck pain.   Eyes: Negative.  Negative for blurred vision.  Respiratory: Negative.  Negative for apnea, cough, choking, chest tightness, shortness of breath, wheezing and stridor.   Cardiovascular: Negative.  Negative for chest pain, palpitations, orthopnea, leg swelling and PND.  Gastrointestinal: Negative.  Negative for nausea, vomiting, abdominal pain, diarrhea, constipation and blood in stool.  Endocrine: Negative.   Genitourinary: Negative.  Negative for dysuria, urgency, frequency, hematuria, decreased urine volume, vaginal bleeding, vaginal discharge and vaginal pain.  Musculoskeletal: Negative.   Skin: Negative.   Allergic/Immunologic: Negative.   Neurological: Negative.  Negative for dizziness, tremors, seizures, syncope, facial asymmetry, speech difficulty, weakness, light-headedness, numbness and headaches.  Hematological: Negative.  Negative for adenopathy. Does not bruise/bleed easily.  Psychiatric/Behavioral: Negative.        Objective:   Physical Exam  Vitals reviewed. Constitutional: She is oriented to person, place, and time. She appears well-developed and well-nourished. No distress.  HENT:  Head: Normocephalic and atraumatic.  Mouth/Throat: Oropharynx is clear and moist. No  oropharyngeal exudate.  Eyes: Conjunctivae are normal. Right eye exhibits no discharge. Left eye exhibits no discharge. No scleral icterus.  Neck: Normal range of motion. Neck supple. No JVD present. No tracheal deviation present. No thyromegaly present.  Cardiovascular: Normal rate, regular rhythm, normal heart sounds and intact distal pulses.  Exam reveals no gallop and no friction rub.   No murmur heard. Pulmonary/Chest: Effort normal and breath sounds normal. No accessory muscle usage or stridor. Not tachypneic. No respiratory distress. She has no wheezes. She has no rales. Chest wall is not dull to percussion. She exhibits no mass, no tenderness, no bony tenderness, no laceration, no crepitus, no edema, no deformity, no swelling and no retraction. Right breast exhibits no inverted nipple, no mass, no nipple discharge, no skin change and no tenderness. Left breast exhibits no inverted nipple, no mass, no nipple discharge, no skin change and no tenderness. Breasts are symmetrical.  Abdominal: Soft. Bowel sounds are normal. She exhibits no distension and no mass. There is no tenderness. There is no rebound and no guarding.  Musculoskeletal: Normal range of motion. She exhibits no edema and no tenderness.  Lymphadenopathy:    She has no cervical adenopathy.  Neurological: She is oriented to person, place, and time.  Skin: Skin is warm and dry. No rash noted. She is not diaphoretic. No erythema. No pallor.  Psychiatric: She has a normal mood and affect. Her behavior is normal. Judgment and thought content normal.     Lab Results  Component Value Date   WBC 5.7 12/03/2012   HGB 11.0* 12/03/2012   HCT 33.7* 12/03/2012   PLT 255 12/03/2012   GLUCOSE 105* 12/03/2012   ALT 9 12/03/2012   AST 16 12/03/2012   NA 142 12/03/2012   K 3.8 12/03/2012   CL 103 12/03/2012   CREATININE 1.08 12/03/2012  BUN 19 12/03/2012   CO2 29 12/03/2012       Assessment & Plan:

## 2013-02-12 NOTE — Assessment & Plan Note (Signed)
Her CBC is stable I have asked her to get a colonoscopy done

## 2013-02-13 ENCOUNTER — Telehealth: Payer: Self-pay

## 2013-02-13 ENCOUNTER — Encounter: Payer: Self-pay | Admitting: Internal Medicine

## 2013-02-13 DIAGNOSIS — E785 Hyperlipidemia, unspecified: Secondary | ICD-10-CM

## 2013-02-13 NOTE — Telephone Encounter (Signed)
Received pharmacy rejection stating that insurance will not cover zetia  without a prior authorization. Patient must try and fail fluvastatin, pravastatin, or simvastatin. Please advise Thanks

## 2013-02-16 ENCOUNTER — Telehealth: Payer: Self-pay | Admitting: *Deleted

## 2013-02-16 MED ORDER — COLESEVELAM HCL 3.75 G PO PACK: 1.0000 | PACK | Freq: Every day | ORAL | Status: DC

## 2013-02-16 NOTE — Telephone Encounter (Signed)
Change to welchol

## 2013-02-16 NOTE — Telephone Encounter (Signed)
Pt called states Welchol is too expensive.  Please advise

## 2013-02-18 NOTE — Telephone Encounter (Signed)
Spoke with pt advised of MDs message 

## 2013-02-18 NOTE — Telephone Encounter (Signed)
Don't take it then, there are no other alternatives if she can't afford welchol or zetia Just stay on the statin for now

## 2013-02-26 ENCOUNTER — Encounter: Payer: Self-pay | Admitting: Internal Medicine

## 2013-03-11 ENCOUNTER — Ambulatory Visit
Admission: RE | Admit: 2013-03-11 | Discharge: 2013-03-11 | Disposition: A | Discharge status: Home / Self Care | Payer: No Typology Code available for payment source | Source: Ambulatory Visit | Attending: Internal Medicine | Admitting: Internal Medicine

## 2013-03-11 DIAGNOSIS — Z1231 Encounter for screening mammogram for malignant neoplasm of breast: Secondary | ICD-10-CM

## 2013-03-27 ENCOUNTER — Encounter: Payer: Self-pay | Admitting: Internal Medicine

## 2013-03-31 ENCOUNTER — Encounter: Payer: Self-pay | Admitting: Internal Medicine

## 2013-03-31 ENCOUNTER — Ambulatory Visit (INDEPENDENT_AMBULATORY_CARE_PROVIDER_SITE_OTHER): Payer: No Typology Code available for payment source | Admitting: Internal Medicine

## 2013-03-31 VITALS — BP 124/74 | HR 88 | Ht 66.0 in | Wt 223.0 lb

## 2013-03-31 DIAGNOSIS — K8020 Calculus of gallbladder without cholecystitis without obstruction: Secondary | ICD-10-CM

## 2013-03-31 DIAGNOSIS — D649 Anemia, unspecified: Secondary | ICD-10-CM

## 2013-03-31 DIAGNOSIS — Z1211 Encounter for screening for malignant neoplasm of colon: Secondary | ICD-10-CM

## 2013-03-31 MED ORDER — MOVIPREP 100 G PO SOLR: ORAL | Status: DC

## 2013-03-31 NOTE — Patient Instructions (Signed)
You have been scheduled for a colonoscopy with propofol. Please follow written instructions given to you at your visit today.  Please pick up your prep kit at the pharmacy within the next 1-3 days. If you use inhalers (even only as needed), please bring them with you on the day of your procedure. Your physician has requested that you go to www.startemmi.com and enter the access code given to you at your visit today. This web site gives a general overview about your procedure. However, you should still follow specific instructions given to you by our office regarding your preparation for the procedure.  We have sent the following medications to your pharmacy for you to pick up at your convenience: Moviprep                                               We are excited to introduce MyChart, a new best-in-class service that provides you online access to important information in your electronic medical record. We want to make it easier for you to view your health information - all in one secure location - when and where you need it. We expect MyChart will enhance the quality of care and service we provide.  When you register for MyChart, you can:    View your test results.    Request appointments and receive appointment reminders via email.    Request medication renewals.    View your medical history, allergies, medications and immunizations.    Communicate with your physician's office through a password-protected site.    Conveniently print information such as your medication lists.  To find out if MyChart is right for you, please talk to a member of our clinical staff today. We will gladly answer your questions about this free health and wellness tool.  If you are age 18 or older and want a member of your family to have access to your record, you must provide written consent by completing a proxy form available at our office. Please speak to our clinical staff about guidelines regarding  accounts for patients younger than age 18.  As you activate your MyChart account and need any technical assistance, please call the MyChart technical support line at [phone] ([phone]) or email your question to [email or web address]. If you email your question(s), please include your name, a return phone number and the best time to reach you.  If you have non-urgent health-related questions, you can send a message to our office through MyChart at [email or web address]. If you have a medical emergency, call 911.  Thank you for using MyChart as your new health and wellness resource!   MyChart licensed from [employer],  1999-2010. Patents Pending.    

## 2013-04-01 ENCOUNTER — Encounter: Payer: Self-pay | Admitting: Internal Medicine

## 2013-04-01 NOTE — Progress Notes (Signed)
Patient ID: Marisa Robinson, female   DOB: 1955-10-02, 56 y.o.   MRN: [id number] HPI: This is Yeary is a 57 year old female with a past medical history of gallstones, kidney stones, hypothyroidism, anemia and hyperlipidemia who is seen in consultation at the request of Dr. Yetta Barre to consider colorectal cancer screening with colonoscopy. She is here alone today. She reports she is feeling well. She denies abdominal pain. No nausea or vomiting. No change in bowel habits. No diarrhea or constipation. No heartburn, dysphagia or odynophagia. No rectal bleeding or melena. She did have an episode of was determined to be biliary colic in July 1191. She is discharged from the emergency room and by the notes it seems that cholecystectomy was considered. The patient did not followup for surgery after this visit and has not had any recurrent episodes of right upper quadrant pain, nausea or vomiting.   Past Medical History  Diagnosis Date  . Urinary tract infection   . Anemia   . Thyroid disease   . Hyperlipidemia   . History of kidney stones   . Gallstones     Past Surgical History  Procedure Laterality Date  . Etopic surgery  1985, 1987    Current Outpatient Prescriptions  Medication Sig Dispense Refill  . atorvastatin (LIPITOR) 80 MG tablet Take 1 tablet (80 mg total) by mouth daily.  90 tablet  3  . Colesevelam HCl 3.75 G PACK Take 1 packet by mouth daily.  90 each  3  . Misc Natural Products (OSTEO BI-FLEX ADV DOUBLE ST PO) Take by mouth.      . Multiple Vitamin (MULTIVITAMIN WITH MINERALS) TABS Take 1 tablet by mouth daily.      Marland Kitchen olmesartan-hydrochlorothiazide (BENICAR HCT) 20-12.5 MG per tablet Take 1 tablet by mouth daily.  56 tablet  0  . Probiotic Product (PROBIOTIC FORMULA PO) Take 1 capsule by mouth daily. 10X by [facility]      . MOVIPREP 100 G SOLR Use per prep instruction  1 kit  0   No current facility-administered medications for this visit.    No Known Allergies  Family  History  Problem Relation Age of Onset  . Diabetes Father   . Heart disease Father   . Hypertension Sister   . Hypertension Brother   . Cancer Neg Hx   . Early death Neg Hx   . Hyperlipidemia Neg Hx   . Kidney disease Neg Hx   . Stroke Neg Hx     History  Substance Use Topics  . Smoking status: Never Smoker   . Smokeless tobacco: Never Used  . Alcohol Use: No     Comment: occasional, 2 x per year    ROS: As per history of present illness, otherwise negative  BP 124/74  Pulse 88  Ht 5\' 6"  (1.676 m)  Wt 223 lb (101.152 kg)  BMI 36.01 kg/m2  SpO2 98% Constitutional: Well-developed and well-nourished. No distress. HEENT: Normocephalic and atraumatic. Oropharynx is clear and moist. No oropharyngeal exudate. Conjunctivae are normal.  No scleral icterus. Neck: Neck supple. Trachea midline. Cardiovascular: Normal rate, regular rhythm and intact distal pulses.  Pulmonary/chest: Effort normal and breath sounds normal. No wheezing, rales or rhonchi. Abdominal: Soft, nontender, nondistended. Bowel sounds active throughout. There are no masses palpable. No hepatosplenomegaly. Extremities: no clubbing, cyanosis, or edema Lymphadenopathy: No cervical adenopathy noted. Neurological: Alert and oriented to person place and time. Skin: Skin is warm and dry. No rashes noted. Psychiatric: Normal mood and affect. Behavior is normal.  RELEVANT LABS AND IMAGING: CBC    Component Value Date/Time   WBC 4.7 02/12/2013 1154   RBC 4.68 02/12/2013 1154   HGB 11.2* 02/12/2013 1154   HCT 34.2* 02/12/2013 1154   PLT 291.0 02/12/2013 1154   MCV 73.0* 02/12/2013 1154   MCH 24.2* 12/03/2012 0825   MCHC 32.7 02/12/2013 1154   RDW 14.1 02/12/2013 1154   LYMPHSABS 1.9 02/12/2013 1154   MONOABS 0.2 02/12/2013 1154   EOSABS 0.2 02/12/2013 1154   BASOSABS 0.1 02/12/2013 1154    CMP     Component Value Date/Time   NA 137 02/12/2013 1154   K 4.1 02/12/2013 1154   CL 103 02/12/2013 1154   CO2 29 02/12/2013 1154    GLUCOSE 95 02/12/2013 1154   BUN 17 02/12/2013 1154   CREATININE 0.9 02/12/2013 1154   CALCIUM 9.2 02/12/2013 1154   PROT 7.5 02/12/2013 1154   ALBUMIN 4.1 02/12/2013 1154   AST 19 02/12/2013 1154   ALT 17 02/12/2013 1154   ALKPHOS 60 02/12/2013 1154   BILITOT 0.5 02/12/2013 1154   GFRNONAA 55* 12/03/2012 0825   GFRAA 64* 12/03/2012 0825   TSH, B12, folate  -- normal   Iron/TIBC/Ferritin    Component Value Date/Time   IRON 70 02/12/2013 1154   FERRITIN 133.7 02/12/2013 1154     ASSESSMENT/PLAN: 57 year old female with a past medical history of gallstones, kidney stones, hypothyroidism, anemia and hyperlipidemia who is seen in consultation at the request of Dr. Yetta Barre to consider colorectal cancer screening with colonoscopy  1.  CRC screening -- she is average risk for colorectal cancer perspective and I have recommended colonoscopy for screening. We discussed the test today including risks and benefits and she is agreeable to proceed.   2.  Anemia, microcytic -- she has a persistent anemia which is microcytic though her iron studies are normal. He's also previously had FOBT testing which was negative. We are proceeding with colonoscopy as discussed #1. Hematology consult could be considered  3.  Gallstones -- she did have what sounds very classic for an episode of biliary colic in July. This has not recurred. Her liver function tests when checked last month were normal which argues against any obstructive process. If she continues to have right upper quadrant pain recurrent episodes of biliary colic, cholecystectomy would be very appropriate. We discussed this today, she understands, and let me know should further biliary colic episodes occur.

## 2013-04-06 ENCOUNTER — Emergency Department (HOSPITAL_COMMUNITY): Payer: No Typology Code available for payment source

## 2013-04-06 ENCOUNTER — Encounter (HOSPITAL_COMMUNITY): Payer: Self-pay | Admitting: Emergency Medicine

## 2013-04-06 ENCOUNTER — Observation Stay (HOSPITAL_COMMUNITY)
Admission: EM | Admit: 2013-04-06 | Discharge: 2013-04-09 | Disposition: A | Discharge status: Home / Self Care | Payer: No Typology Code available for payment source | Attending: Surgery | Admitting: Surgery

## 2013-04-06 ENCOUNTER — Telehealth: Payer: Self-pay | Admitting: Internal Medicine

## 2013-04-06 DIAGNOSIS — K819 Cholecystitis, unspecified: Secondary | ICD-10-CM

## 2013-04-06 DIAGNOSIS — E119 Type 2 diabetes mellitus without complications: Secondary | ICD-10-CM | POA: Insufficient documentation

## 2013-04-06 DIAGNOSIS — K8000 Calculus of gallbladder with acute cholecystitis without obstruction: Principal | ICD-10-CM | POA: Insufficient documentation

## 2013-04-06 DIAGNOSIS — K8010 Calculus of gallbladder with chronic cholecystitis without obstruction: Secondary | ICD-10-CM

## 2013-04-06 DIAGNOSIS — I10 Essential (primary) hypertension: Secondary | ICD-10-CM | POA: Insufficient documentation

## 2013-04-06 DIAGNOSIS — K838 Other specified diseases of biliary tract: Secondary | ICD-10-CM | POA: Insufficient documentation

## 2013-04-06 DIAGNOSIS — D649 Anemia, unspecified: Secondary | ICD-10-CM | POA: Insufficient documentation

## 2013-04-06 HISTORY — DX: Hypothyroidism, unspecified: E03.9

## 2013-04-06 HISTORY — DX: Essential (primary) hypertension: I10

## 2013-04-06 LAB — COMPREHENSIVE METABOLIC PANEL
ALT: 28 U/L (ref 0–35)
AST: 29 U/L (ref 0–37)
Albumin: 4.5 g/dL (ref 3.5–5.2)
Alkaline Phosphatase: 106 U/L (ref 39–117)
BUN: 18 mg/dL (ref 6–23)
CO2: 27 mEq/L (ref 19–32)
Calcium: 9.6 mg/dL (ref 8.4–10.5)
Chloride: 100 mEq/L (ref 96–112)
Creatinine, Ser: 0.93 mg/dL (ref 0.50–1.10)
GFR calc Af Amer: 77 mL/min — ABNORMAL LOW (ref >90)
GFR calc non Af Amer: 66 mL/min — ABNORMAL LOW (ref >90)
Glucose, Bld: 110 mg/dL — ABNORMAL HIGH (ref 70–99)
Potassium: 3.7 mEq/L (ref 3.5–5.1)
Sodium: 137 mEq/L (ref 135–145)
Total Bilirubin: 0.5 mg/dL (ref 0.3–1.2)
Total Protein: 8.1 g/dL (ref 6.0–8.3)

## 2013-04-06 LAB — URINALYSIS, ROUTINE W REFLEX MICROSCOPIC
Bilirubin Urine: NEGATIVE
Leukocytes, UA: NEGATIVE
Nitrite: NEGATIVE
Protein, ur: NEGATIVE mg/dL
Specific Gravity, Urine: 1.016 (ref 1.005–1.030)
Urobilinogen, UA: 1 mg/dL (ref 0.0–1.0)
pH: 6.5 (ref 5.0–8.0)

## 2013-04-06 LAB — CBC WITH DIFFERENTIAL/PLATELET
Basophils Absolute: 0 10*3/uL (ref 0.0–0.1)
Basophils Relative: 1 % (ref 0–1)
Eosinophils Absolute: 0.2 10*3/uL (ref 0.0–0.7)
Eosinophils Relative: 3 % (ref 0–5)
Hemoglobin: 11.3 g/dL — ABNORMAL LOW (ref 12.0–15.0)
Lymphs Abs: 1.6 10*3/uL (ref 0.7–4.0)
MCH: 24.2 pg — ABNORMAL LOW (ref 26.0–34.0)
MCHC: 32.6 g/dL (ref 30.0–36.0)
MCV: 74.3 fL — ABNORMAL LOW (ref 78.0–100.0)
Monocytes Absolute: 0.3 10*3/uL (ref 0.1–1.0)
Monocytes Relative: 4 % (ref 3–12)
Neutro Abs: 3.8 10*3/uL (ref 1.7–7.7)
RBC: 4.67 MIL/uL (ref 3.87–5.11)
RDW: 14.6 % (ref 11.5–15.5)
WBC: 5.9 10*3/uL (ref 4.0–10.5)

## 2013-04-06 LAB — LIPASE, BLOOD: Lipase: 27 U/L (ref 11–59)

## 2013-04-06 MED ORDER — AMPICILLIN-SULBACTAM SODIUM 3 (2-1) G IJ SOLR
3.0000 g | Freq: Four times a day (QID) | INTRAMUSCULAR | Status: DC
  Administered 2013-04-06 – 2013-04-09 (×11): 3 g via INTRAVENOUS
  Filled 2013-04-06 (×13): qty 3

## 2013-04-06 MED ORDER — HEPARIN SODIUM (PORCINE) 5000 UNIT/ML IJ SOLN
5000.0000 [IU] | Freq: Three times a day (TID) | INTRAMUSCULAR | Status: DC
  Administered 2013-04-06: 5000 [IU] via SUBCUTANEOUS
  Filled 2013-04-06 (×5): qty 1

## 2013-04-06 MED ORDER — ACETAMINOPHEN 325 MG PO TABS: 650.0000 mg | ORAL_TABLET | Freq: Four times a day (QID) | ORAL | Status: DC | PRN

## 2013-04-06 MED ORDER — ONDANSETRON HCL 4 MG/2ML IJ SOLN
4.0000 mg | Freq: Four times a day (QID) | INTRAMUSCULAR | Status: DC | PRN
  Administered 2013-04-08 (×2): 4 mg via INTRAVENOUS
  Filled 2013-04-06 (×2): qty 2

## 2013-04-06 MED ORDER — SODIUM CHLORIDE 0.9 % IV SOLN: INTRAVENOUS | Status: DC

## 2013-04-06 MED ORDER — ONDANSETRON HCL 4 MG/2ML IJ SOLN
4.0000 mg | Freq: Once | INTRAMUSCULAR | Status: AC
  Administered 2013-04-06: 4 mg via INTRAVENOUS
  Filled 2013-04-06: qty 2

## 2013-04-06 MED ORDER — HYDROMORPHONE HCL PF 1 MG/ML IJ SOLN
1.0000 mg | Freq: Once | INTRAMUSCULAR | Status: AC
  Administered 2013-04-06: 1 mg via INTRAVENOUS
  Filled 2013-04-06: qty 1

## 2013-04-06 MED ORDER — HYDROMORPHONE HCL PF 1 MG/ML IJ SOLN
1.0000 mg | INTRAMUSCULAR | Status: DC | PRN
  Administered 2013-04-07: 1 mg via INTRAVENOUS
  Filled 2013-04-06: qty 1

## 2013-04-06 MED ORDER — ACETAMINOPHEN 650 MG RE SUPP: 650.0000 mg | Freq: Four times a day (QID) | RECTAL | Status: DC | PRN

## 2013-04-06 MED ORDER — SODIUM CHLORIDE 0.9 % IV BOLUS (SEPSIS)
1000.0000 mL | Freq: Once | INTRAVENOUS | Status: AC
  Administered 2013-04-06: 1000 mL via INTRAVENOUS

## 2013-04-06 MED ORDER — MORPHINE SULFATE 4 MG/ML IJ SOLN
6.0000 mg | Freq: Once | INTRAMUSCULAR | Status: AC
  Administered 2013-04-06: 6 mg via INTRAVENOUS
  Filled 2013-04-06: qty 2

## 2013-04-06 NOTE — ED Notes (Signed)
MD at bedside. 

## 2013-04-06 NOTE — Telephone Encounter (Signed)
Pt went to the ER.

## 2013-04-06 NOTE — ED Notes (Signed)
Per pt, yesterday pt woke up, back was hurting, it stopped hurting, then today it started hurting again. Pt vomited x4 today and feels nausea. Right flank pain. Denies problems urinating, denies unusual discharge. Pt states when she came here 3-4 months ago they told her she had kidney stones. Pain 10/10.

## 2013-04-06 NOTE — ED Provider Notes (Signed)
CSN: [id number]     Arrival date & time 04/06/13  1442 History   First MD Initiated Contact with Patient 04/06/13 1641     Chief Complaint  Patient presents with  . Nausea  . Back Pain   (Consider location/radiation/quality/duration/timing/severity/associated sxs/prior Treatment) The history is provided by the patient.  Coree Riester is a 57 y.o. female hx of UTI, gallstones here with RUQ pain and nausea. Right-sided abdominal as well as right flank pain since yesterday. He was intermittent and sharp initially now becomes constant. Associated with some nausea but no vomiting. She said it's similar to her previous gallstones. She never followed up with a surgeon. Denies fever chills or urinary symptoms.    Past Medical History  Diagnosis Date  . Urinary tract infection   . Anemia   . Thyroid disease   . Hyperlipidemia   . History of kidney stones   . Gallstones    Past Surgical History  Procedure Laterality Date  . Etopic surgery  1985, 1987   Family History  Problem Relation Age of Onset  . Diabetes Father   . Heart disease Father   . Hypertension Sister   . Hypertension Brother   . Cancer Neg Hx   . Early death Neg Hx   . Hyperlipidemia Neg Hx   . Kidney disease Neg Hx   . Stroke Neg Hx    History  Substance Use Topics  . Smoking status: Never Smoker   . Smokeless tobacco: Never Used  . Alcohol Use: No     Comment: occasional, 2 x per year   OB History   Grav Para Term Preterm Abortions TAB SAB Ect Mult Living                 Review of Systems  Gastrointestinal: Positive for abdominal pain.  Musculoskeletal: Positive for back pain.  All other systems reviewed and are negative.    Allergies  Review of patient's allergies indicates no known allergies.  Home Medications   Current Outpatient Rx  Name  Route  Sig  Dispense  Refill  . atorvastatin (LIPITOR) 80 MG tablet   Oral   Take 80 mg by mouth daily.         . naproxen sodium (ANAPROX) 220 MG  tablet   Oral   Take 220 mg by mouth daily as needed (for pain).         Marland Kitchen olmesartan-hydrochlorothiazide (BENICAR HCT) 20-12.5 MG per tablet   Oral   Take 1 tablet by mouth daily.          BP 145/73  Pulse 63  Temp(Src) 97.8 F (36.6 C) (Oral)  Resp 16  Wt 221 lb 14.4 oz (100.653 kg)  SpO2 97% Physical Exam  Nursing note and vitals reviewed. Constitutional: She is oriented to person, place, and time.  Uncomfortable   HENT:  Head: Normocephalic.  Mouth/Throat: Oropharynx is clear and moist.  Eyes: Conjunctivae are normal. Pupils are equal, round, and reactive to light.  Neck: Normal range of motion. Neck supple.  Cardiovascular: Normal rate, regular rhythm and normal heart sounds.   Pulmonary/Chest: Effort normal and breath sounds normal. No respiratory distress. She has no wheezes. She has no rales.  Abdominal: Soft. Bowel sounds are normal.  + RUQ tenderness, ? Murphy's. + R CVAT   Musculoskeletal: Normal range of motion.  Neurological: She is alert and oriented to person, place, and time.  Skin: Skin is warm and dry.  Psychiatric: She has a normal  mood and affect. Her behavior is normal. Judgment and thought content normal.    ED Course  Procedures (including critical care time) Labs Review Labs Reviewed  CBC WITH DIFFERENTIAL - Abnormal; Notable for the following:    Hemoglobin 11.3 (*)    HCT 34.7 (*)    MCV 74.3 (*)    MCH 24.2 (*)    All other components within normal limits  COMPREHENSIVE METABOLIC PANEL - Abnormal; Notable for the following:    Glucose, Bld 110 (*)    GFR calc non Af Amer 66 (*)    GFR calc Af Amer 77 (*)    All other components within normal limits  LIPASE, BLOOD  URINALYSIS, ROUTINE W REFLEX MICROSCOPIC   Imaging Review US Abdomen Complete  04/06/2013   CLINICAL DATA:  Right upper quadrant abdominal pain.  EXAM: ULTRASOUND ABDOMEN COMPLETE  COMPARISON:  None.  FINDINGS: Gallbladder  Numerous echogenic gallstones are noted in the  gallbladder with associated acoustic shadowing. No gallbladder wall thickening or pericholecystic fluid but there is a positive sonographic Murphy sign.  Common bile duct  Diameter: Dilated at 8.8 mm. No definite common bile duct stones.  Liver  Normal echogenicity without focal lesions. Mild central intrahepatic biliary dilatation.  IVC  Normal caliber.  Pancreas  Sonographically normal.  Spleen  Normal size and echogenicity without focal lesions.  Right Kidney  Length: 10.3 cm. Echogenicity within normal limits. No mass or hydronephrosis visualized.  Left Kidney  Length: 10.6 cm. Echogenicity within normal limits. No mass or hydronephrosis visualized.  Abdominal aorta  Normal caliber.  IMPRESSION: Chololithiasis and positive sonographic Murphy sign and mild biliary dilatation. Findings suggest cholecystitis.  The remainder of the examination is unremarkable.   Electronically Signed   By: Loralie Champagne M.D.   On: 04/06/2013 19:19    EKG Interpretation   None       MDM  No diagnosis found. Danyiel Crespin is a 57 y.o. female here with R flank and RUQ pain. Likely biliary colic vs renal colic. Will get [place] and labs and UA. Will give pain meds and reassess.   7:30pm US showed cholecystitis. I called Dr. Dwain Sarna, who will admit for cholecystectomy.   Richardean Canal, MD 04/06/13 2039

## 2013-04-06 NOTE — ED Notes (Signed)
Attempted to call report.  Floor reported that the patient will not be coming to them; bed placement to place pt to another floor.

## 2013-04-06 NOTE — ED Notes (Addendum)
Attempted to start an IV x2, was unsuccessful, spoke with Dr. Silverio Lay, MD will place IV with ultrasound.

## 2013-04-06 NOTE — ED Notes (Signed)
Back pain and nausea since yesterday vomiting at triage saw a dr on 10/29 for GB screening

## 2013-04-06 NOTE — ED Notes (Signed)
Bed changed to 5N. Attempted to call report.  RN busy, will return my call.

## 2013-04-06 NOTE — ED Notes (Signed)
Surgeon into room for consult.

## 2013-04-06 NOTE — ED Notes (Signed)
Report to [facility] on 5th floor.

## 2013-04-06 NOTE — ED Notes (Signed)
Pt still in US

## 2013-04-06 NOTE — H&P (Signed)
Marisa Robinson is an 57 y.o. female.   Chief Complaint: ruq and back pain HPI: 33 yof referred by Dr Chaney Malling who presents with <24 hours of ruq pain, back pain primarily on right and n/v.  She had prior episode for which she saw one of my partners in July and was recommended cholecystectomy.  She has not had any further episodes until this one.  She has not been able to eat.  Denies fevers. Nothing is helping this pain now except for dilaudid she just received.  She saw Dr Rhea Belton recently to discuss a screening colonoscopy also. She presents to er due to unrelenting pain.  Past Medical History  Diagnosis Date  . Urinary tract infection   . Anemia   . Thyroid disease   . Hyperlipidemia   . History of kidney stones   . Gallstones     Past Surgical History  Procedure Laterality Date  . Etopic surgery  1985, 1987    Family History  Problem Relation Age of Onset  . Diabetes Father   . Heart disease Father   . Hypertension Sister   . Hypertension Brother   . Cancer Neg Hx   . Early death Neg Hx   . Hyperlipidemia Neg Hx   . Kidney disease Neg Hx   . Stroke Neg Hx    Social History:  reports that she has never smoked. She has never used smokeless tobacco. She reports that she does not drink alcohol or use illicit drugs.  Allergies: No Known Allergies  meds benicar  Results for orders placed during the hospital encounter of 04/06/13 (from the past 48 hour(s))  CBC WITH DIFFERENTIAL     Status: Abnormal   Collection Time    04/06/13  2:55 PM      Result Value Range   WBC 5.9  4.0 - 10.5 K/uL   RBC 4.67  3.87 - 5.11 MIL/uL   Hemoglobin 11.3 (*) 12.0 - 15.0 g/dL   HCT 14.7 (*) 82.9 - 56.2 %   MCV 74.3 (*) 78.0 - 100.0 fL   MCH 24.2 (*) 26.0 - 34.0 pg   MCHC 32.6  30.0 - 36.0 g/dL   RDW 13.0  86.5 - 78.4 %   Platelets 276  150 - 400 K/uL   Neutrophils Relative % 65  43 - 77 %   Neutro Abs 3.8  1.7 - 7.7 K/uL   Lymphocytes Relative 28  12 - 46 %   Lymphs Abs 1.6  0.7 -  4.0 K/uL   Monocytes Relative 4  3 - 12 %   Monocytes Absolute 0.3  0.1 - 1.0 K/uL   Eosinophils Relative 3  0 - 5 %   Eosinophils Absolute 0.2  0.0 - 0.7 K/uL   Basophils Relative 1  0 - 1 %   Basophils Absolute 0.0  0.0 - 0.1 K/uL  COMPREHENSIVE METABOLIC PANEL     Status: Abnormal   Collection Time    04/06/13  2:55 PM      Result Value Range   Sodium 137  135 - 145 mEq/L   Potassium 3.7  3.5 - 5.1 mEq/L   Chloride 100  96 - 112 mEq/L   CO2 27  19 - 32 mEq/L   Glucose, Bld 110 (*) 70 - 99 mg/dL   BUN 18  6 - 23 mg/dL   Creatinine, Ser 6.96  0.50 - 1.10 mg/dL   Calcium 9.6  8.4 - 29.5 mg/dL   Total  Protein 8.1  6.0 - 8.3 g/dL   Albumin 4.5  3.5 - 5.2 g/dL   AST 29  0 - 37 U/L   ALT 28  0 - 35 U/L   Alkaline Phosphatase 106  39 - 117 U/L   Total Bilirubin 0.5  0.3 - 1.2 mg/dL   GFR calc non Af Amer 66 (*) >90 mL/min   GFR calc Af Amer 77 (*) >90 mL/min   Comment: (NOTE)     The eGFR has been calculated using the CKD EPI equation.     This calculation has not been validated in all clinical situations.     eGFR's persistently <90 mL/min signify possible Chronic Kidney     Disease.  LIPASE, BLOOD     Status: None   Collection Time    04/06/13  2:55 PM      Result Value Range   Lipase 27  11 - 59 U/L   US Abdomen Complete  04/06/2013   CLINICAL DATA:  Right upper quadrant abdominal pain.  EXAM: ULTRASOUND ABDOMEN COMPLETE  COMPARISON:  None.  FINDINGS: Gallbladder  Numerous echogenic gallstones are noted in the gallbladder with associated acoustic shadowing. No gallbladder wall thickening or pericholecystic fluid but there is a positive sonographic Murphy sign.  Common bile duct  Diameter: Dilated at 8.8 mm. No definite common bile duct stones.  Liver  Normal echogenicity without focal lesions. Mild central intrahepatic biliary dilatation.  IVC  Normal caliber.  Pancreas  Sonographically normal.  Spleen  Normal size and echogenicity without focal lesions.  Right Kidney  Length:  10.3 cm. Echogenicity within normal limits. No mass or hydronephrosis visualized.  Left Kidney  Length: 10.6 cm. Echogenicity within normal limits. No mass or hydronephrosis visualized.  Abdominal aorta  Normal caliber.  IMPRESSION: Chololithiasis and positive sonographic Murphy sign and mild biliary dilatation. Findings suggest cholecystitis.  The remainder of the examination is unremarkable.   Electronically Signed   By: Loralie Champagne M.D.   On: 04/06/2013 19:19    Review of Systems  Constitutional: Negative for fever and chills.  Respiratory: Negative for shortness of breath.   Cardiovascular: Negative for chest pain and leg swelling.  Gastrointestinal: Positive for vomiting and abdominal pain. Negative for diarrhea and constipation.  Musculoskeletal: Positive for back pain.    Blood pressure 145/73, pulse 63, temperature 97.8 F (36.6 C), temperature source Oral, resp. rate 16, weight 221 lb 14.4 oz (100.653 kg), SpO2 97.00%. Physical Exam  Vitals reviewed. Constitutional: She is oriented to person, place, and time. She appears well-developed and well-nourished. No distress.  Eyes: No scleral icterus.  Neck: Neck supple.  Cardiovascular: Normal rate, regular rhythm, normal heart sounds and intact distal pulses.   Respiratory: Effort normal and breath sounds normal. She has no wheezes. She has no rales.  GI: Soft. Bowel sounds are normal. She exhibits no distension. There is tenderness in the right upper quadrant. There is positive Murphy's sign.  Lymphadenopathy:    She has no cervical adenopathy.  Neurological: She is alert and oriented to person, place, and time.     Assessment/Plan Cholecystitis  Admission, abx, cholecystectomy in am with Dr Magnus Ivan. I discussed the procedure in detail.    We discussed the risks and benefits of a laparoscopic cholecystectomy and possible cholangiogram.  She is somnolent during this exam due to dilaudid she just received. She fell asleep when I  was talking to her. The likelihood of improvement in symptoms and return to the patient's normal  status is good. We discussed the typical post-operative recovery course.   Marisa Robinson 04/06/2013, 8:22 PM

## 2013-04-06 NOTE — ED Notes (Signed)
Returned from [place].  Pt reports her pain "still hurts" rates 10/10.

## 2013-04-06 NOTE — ED Notes (Signed)
Surgical consult completed.  Pt to be admitted; plan is to admit and perform chole tomorrow.  Pt aware.

## 2013-04-07 ENCOUNTER — Encounter (HOSPITAL_COMMUNITY): Payer: No Typology Code available for payment source | Admitting: Anesthesiology

## 2013-04-07 ENCOUNTER — Observation Stay (HOSPITAL_COMMUNITY): Payer: No Typology Code available for payment source

## 2013-04-07 ENCOUNTER — Encounter (HOSPITAL_COMMUNITY)
Admission: EM | Disposition: A | Discharge status: Home / Self Care | Payer: Self-pay | Source: Home / Self Care | Attending: Emergency Medicine

## 2013-04-07 ENCOUNTER — Encounter (HOSPITAL_COMMUNITY): Payer: Self-pay | Admitting: Anesthesiology

## 2013-04-07 ENCOUNTER — Observation Stay (HOSPITAL_COMMUNITY): Payer: No Typology Code available for payment source | Admitting: Anesthesiology

## 2013-04-07 HISTORY — PX: CHOLECYSTECTOMY: SHX55

## 2013-04-07 LAB — COMPREHENSIVE METABOLIC PANEL
AST: 39 U/L — ABNORMAL HIGH (ref 0–37)
Albumin: 3.8 g/dL (ref 3.5–5.2)
Alkaline Phosphatase: 108 U/L (ref 39–117)
BUN: 14 mg/dL (ref 6–23)
CO2: 24 mEq/L (ref 19–32)
Chloride: 101 mEq/L (ref 96–112)
GFR calc non Af Amer: >90 mL/min (ref >90)
Potassium: 3.7 mEq/L (ref 3.5–5.1)
Total Bilirubin: 0.6 mg/dL (ref 0.3–1.2)
Total Protein: 7.4 g/dL (ref 6.0–8.3)

## 2013-04-07 LAB — SURGICAL PCR SCREEN
MRSA, PCR: NEGATIVE
Staphylococcus aureus: NEGATIVE

## 2013-04-07 SURGERY — LAPAROSCOPIC CHOLECYSTECTOMY WITH INTRAOPERATIVE CHOLANGIOGRAM
Anesthesia: General | Wound class: Clean Contaminated

## 2013-04-07 MED ORDER — OXYCODONE HCL 5 MG PO TABS: 5.0000 mg | ORAL_TABLET | Freq: Once | ORAL | Status: DC | PRN

## 2013-04-07 MED ORDER — SODIUM CHLORIDE 0.9 % IR SOLN
Status: DC | PRN
  Administered 2013-04-07: 1000 mL

## 2013-04-07 MED ORDER — ATORVASTATIN CALCIUM 80 MG PO TABS
80.0000 mg | ORAL_TABLET | Freq: Every day | ORAL | Status: DC
  Administered 2013-04-07 – 2013-04-09 (×3): 80 mg via ORAL
  Filled 2013-04-07 (×4): qty 1

## 2013-04-07 MED ORDER — MORPHINE SULFATE 4 MG/ML IJ SOLN: 4.0000 mg | INTRAMUSCULAR | Status: DC | PRN

## 2013-04-07 MED ORDER — NEOSTIGMINE METHYLSULFATE 1 MG/ML IJ SOLN
INTRAMUSCULAR | Status: DC | PRN
  Administered 2013-04-07: 5 mg via INTRAVENOUS

## 2013-04-07 MED ORDER — HEPARIN SODIUM (PORCINE) 5000 UNIT/ML IJ SOLN
5000.0000 [IU] | Freq: Three times a day (TID) | INTRAMUSCULAR | Status: DC
  Administered 2013-04-07 – 2013-04-09 (×5): 5000 [IU] via SUBCUTANEOUS
  Filled 2013-04-07 (×8): qty 1

## 2013-04-07 MED ORDER — HYDROCHLOROTHIAZIDE 12.5 MG PO CAPS
12.5000 mg | ORAL_CAPSULE | Freq: Every day | ORAL | Status: DC
  Administered 2013-04-07 – 2013-04-09 (×3): 12.5 mg via ORAL
  Filled 2013-04-07 (×4): qty 1

## 2013-04-07 MED ORDER — SODIUM CHLORIDE 0.9 % IV SOLN
INTRAVENOUS | Status: DC
  Administered 2013-04-07 – 2013-04-08 (×3): via INTRAVENOUS

## 2013-04-07 MED ORDER — HYDROMORPHONE HCL PF 1 MG/ML IJ SOLN: 0.2500 mg | INTRAMUSCULAR | Status: DC | PRN

## 2013-04-07 MED ORDER — OXYCODONE-ACETAMINOPHEN 5-325 MG PO TABS
1.0000 | ORAL_TABLET | ORAL | Status: DC | PRN
  Administered 2013-04-07 – 2013-04-08 (×3): 2 via ORAL
  Filled 2013-04-07 (×3): qty 2

## 2013-04-07 MED ORDER — CHLORHEXIDINE GLUCONATE 4 % EX LIQD
Freq: Once | CUTANEOUS | Status: AC
  Administered 2013-04-07: 08:00:00 via TOPICAL
  Filled 2013-04-07: qty 15

## 2013-04-07 MED ORDER — KETOROLAC TROMETHAMINE 30 MG/ML IJ SOLN
INTRAMUSCULAR | Status: DC | PRN
  Administered 2013-04-07: 30 mg via INTRAVENOUS

## 2013-04-07 MED ORDER — ARTIFICIAL TEARS OP OINT
TOPICAL_OINTMENT | OPHTHALMIC | Status: DC | PRN
  Administered 2013-04-07: 1 via OPHTHALMIC

## 2013-04-07 MED ORDER — BUPIVACAINE-EPINEPHRINE PF 0.25-1:200000 % IJ SOLN
INTRAMUSCULAR | Status: AC
  Filled 2013-04-07: qty 30

## 2013-04-07 MED ORDER — IRBESARTAN 150 MG PO TABS
150.0000 mg | ORAL_TABLET | Freq: Every day | ORAL | Status: DC
  Administered 2013-04-07 – 2013-04-09 (×3): 150 mg via ORAL
  Filled 2013-04-07 (×4): qty 1

## 2013-04-07 MED ORDER — BUPIVACAINE-EPINEPHRINE (PF) 0.5% -1:200000 IJ SOLN
INTRAMUSCULAR | Status: AC
  Filled 2013-04-07: qty 10

## 2013-04-07 MED ORDER — LIDOCAINE HCL (CARDIAC) 20 MG/ML IV SOLN
INTRAVENOUS | Status: DC | PRN
  Administered 2013-04-07: 40 mg via INTRAVENOUS

## 2013-04-07 MED ORDER — FENTANYL CITRATE 0.05 MG/ML IJ SOLN
INTRAMUSCULAR | Status: DC | PRN
  Administered 2013-04-07 (×2): 50 ug via INTRAVENOUS

## 2013-04-07 MED ORDER — DEXAMETHASONE SODIUM PHOSPHATE 4 MG/ML IJ SOLN
INTRAMUSCULAR | Status: DC | PRN
  Administered 2013-04-07: 4 mg via INTRAVENOUS

## 2013-04-07 MED ORDER — PROPOFOL 10 MG/ML IV BOLUS
INTRAVENOUS | Status: DC | PRN
  Administered 2013-04-07: 200 mg via INTRAVENOUS

## 2013-04-07 MED ORDER — SODIUM CHLORIDE 0.9 % IV SOLN
INTRAVENOUS | Status: DC | PRN
  Administered 2013-04-07: 10:00:00

## 2013-04-07 MED ORDER — ONDANSETRON HCL 4 MG/2ML IJ SOLN
INTRAMUSCULAR | Status: DC | PRN
  Administered 2013-04-07: 4 mg via INTRAVENOUS

## 2013-04-07 MED ORDER — 0.9 % SODIUM CHLORIDE (POUR BTL) OPTIME
TOPICAL | Status: DC | PRN
  Administered 2013-04-07: 1000 mL

## 2013-04-07 MED ORDER — OXYCODONE HCL 5 MG/5ML PO SOLN: 5.0000 mg | Freq: Once | ORAL | Status: DC | PRN

## 2013-04-07 MED ORDER — BUPIVACAINE-EPINEPHRINE 0.25% -1:200000 IJ SOLN
INTRAMUSCULAR | Status: DC | PRN
  Administered 2013-04-07: 20 mL

## 2013-04-07 MED ORDER — OLMESARTAN MEDOXOMIL-HCTZ 20-12.5 MG PO TABS: 1.0000 | ORAL_TABLET | Freq: Every day | ORAL | Status: DC

## 2013-04-07 MED ORDER — PHENYLEPHRINE HCL 10 MG/ML IJ SOLN
INTRAMUSCULAR | Status: DC | PRN
  Administered 2013-04-07: 160 ug via INTRAVENOUS

## 2013-04-07 MED ORDER — METOCLOPRAMIDE HCL 5 MG/ML IJ SOLN: 10.0000 mg | Freq: Once | INTRAMUSCULAR | Status: DC | PRN

## 2013-04-07 MED ORDER — MIDAZOLAM HCL 5 MG/5ML IJ SOLN
INTRAMUSCULAR | Status: DC | PRN
  Administered 2013-04-07: 1 mg via INTRAVENOUS

## 2013-04-07 MED ORDER — ROCURONIUM BROMIDE 100 MG/10ML IV SOLN
INTRAVENOUS | Status: DC | PRN
  Administered 2013-04-07: 10 mg via INTRAVENOUS
  Administered 2013-04-07: 40 mg via INTRAVENOUS

## 2013-04-07 MED ORDER — GLYCOPYRROLATE 0.2 MG/ML IJ SOLN
INTRAMUSCULAR | Status: DC | PRN
  Administered 2013-04-07: .8 mg via INTRAVENOUS

## 2013-04-07 MED ORDER — LACTATED RINGERS IV SOLN
INTRAVENOUS | Status: DC
  Administered 2013-04-07 (×2): via INTRAVENOUS

## 2013-04-07 SURGICAL SUPPLY — 45 items
APL SKNCLS STERI-STRIP NONHPOA (GAUZE/BANDAGES/DRESSINGS) ×1
APPLIER CLIP 5 13 M/L LIGAMAX5 (MISCELLANEOUS) ×2
APR CLP MED LRG 5 ANG JAW (MISCELLANEOUS) ×1
BAG SPEC RTRVL LRG 6X4 10 (ENDOMECHANICALS)
BANDAGE ADHESIVE 1X3 (GAUZE/BANDAGES/DRESSINGS) ×8 IMPLANT
BENZOIN TINCTURE PRP APPL 2/3 (GAUZE/BANDAGES/DRESSINGS) ×2 IMPLANT
CANISTER SUCTION 2500CC (MISCELLANEOUS) ×2 IMPLANT
CHLORAPREP W/TINT 26ML (MISCELLANEOUS) ×2 IMPLANT
CLIP APPLIE 5 13 M/L LIGAMAX5 (MISCELLANEOUS) ×1 IMPLANT
COVER MAYO STAND STRL (DRAPES) IMPLANT
COVER SURGICAL LIGHT HANDLE (MISCELLANEOUS) ×2 IMPLANT
DECANTER SPIKE VIAL GLASS SM (MISCELLANEOUS) ×2 IMPLANT
DRAPE C-ARM 42X72 X-RAY (DRAPES) IMPLANT
ELECT REM PT RETURN 9FT ADLT (ELECTROSURGICAL) ×2
ELECTRODE REM PT RTRN 9FT ADLT (ELECTROSURGICAL) ×1 IMPLANT
GLOVE BIO SURGEON STRL SZ 6.5 (GLOVE) ×2 IMPLANT
GLOVE BIO SURGEON STRL SZ7.5 (GLOVE) ×2 IMPLANT
GLOVE BIOGEL PI IND STRL 6.5 (GLOVE) ×1 IMPLANT
GLOVE BIOGEL PI IND STRL 7.0 (GLOVE) ×2 IMPLANT
GLOVE BIOGEL PI IND STRL 7.5 (GLOVE) ×1 IMPLANT
GLOVE BIOGEL PI INDICATOR 6.5 (GLOVE) ×1
GLOVE BIOGEL PI INDICATOR 7.0 (GLOVE) ×2
GLOVE BIOGEL PI INDICATOR 7.5 (GLOVE) ×1
GLOVE SURG SIGNA 7.5 PF LTX (GLOVE) ×2 IMPLANT
GLOVE SURG SS PI 7.0 STRL IVOR (GLOVE) ×2 IMPLANT
GOWN STRL NON-REIN LRG LVL3 (GOWN DISPOSABLE) ×6 IMPLANT
GOWN STRL REIN XL XLG (GOWN DISPOSABLE) ×2 IMPLANT
KIT BASIN OR (CUSTOM PROCEDURE TRAY) ×2 IMPLANT
KIT ROOM TURNOVER OR (KITS) ×2 IMPLANT
NS IRRIG 1000ML POUR BTL (IV SOLUTION) ×2 IMPLANT
PAD ARMBOARD 7.5X6 YLW CONV (MISCELLANEOUS) ×2 IMPLANT
POUCH SPECIMEN RETRIEVAL 10MM (ENDOMECHANICALS) IMPLANT
SCISSORS LAP 5X35 DISP (ENDOMECHANICALS) IMPLANT
SET CHOLANGIOGRAPH 5 50 .035 (SET/KITS/TRAYS/PACK) IMPLANT
SET IRRIG TUBING LAPAROSCOPIC (IRRIGATION / IRRIGATOR) ×2 IMPLANT
SLEEVE ENDOPATH XCEL 5M (ENDOMECHANICALS) ×4 IMPLANT
SPECIMEN JAR SMALL (MISCELLANEOUS) ×2 IMPLANT
STRIP CLOSURE SKIN 1/2X4 (GAUZE/BANDAGES/DRESSINGS) ×2 IMPLANT
SUT MON AB 4-0 PC3 18 (SUTURE) ×2 IMPLANT
SUT VICRYL 0 UR6 27IN ABS (SUTURE) ×2 IMPLANT
TOWEL OR 17X24 6PK STRL BLUE (TOWEL DISPOSABLE) ×2 IMPLANT
TOWEL OR 17X26 10 PK STRL BLUE (TOWEL DISPOSABLE) ×2 IMPLANT
TRAY LAPAROSCOPIC (CUSTOM PROCEDURE TRAY) ×2 IMPLANT
TROCAR XCEL BLUNT TIP 100MML (ENDOMECHANICALS) ×2 IMPLANT
TROCAR XCEL NON-BLD 5MMX100MML (ENDOMECHANICALS) ×2 IMPLANT

## 2013-04-07 NOTE — Anesthesia Preprocedure Evaluation (Addendum)
Anesthesia Evaluation  Patient identified by MRN, date of birth, ID band Patient awake    Reviewed: Allergy & Precautions, H&P , NPO status , Patient's Chart, lab work & pertinent test results, reviewed documented beta blocker date and time   Airway Mallampati: II TM Distance: >3 FB Neck ROM: full    Dental  (+) Teeth Intact, Partial Upper, Missing and Dental Advisory Given   Pulmonary neg pulmonary ROS,  breath sounds clear to auscultation        Cardiovascular hypertension, On Medications Rhythm:regular     Neuro/Psych negative neurological ROS  negative psych ROS   GI/Hepatic negative GI ROS, Neg liver ROS,   Endo/Other  diabetes  Renal/GU negative Renal ROS  negative genitourinary   Musculoskeletal   Abdominal   Peds  Hematology negative hematology ROS (+) anemia ,   Anesthesia Other Findings See surgeon's H&P   Reproductive/Obstetrics negative OB ROS                          Anesthesia Physical Anesthesia Plan  ASA: II  Anesthesia Plan: General   Post-op Pain Management:    Induction: Intravenous  Airway Management Planned: Oral ETT  Additional Equipment:   Intra-op Plan:   Post-operative Plan: Extubation in OR  Informed Consent: I have reviewed the patients History and Physical, chart, labs and discussed the procedure including the risks, benefits and alternatives for the proposed anesthesia with the patient or authorized representative who has indicated his/her understanding and acceptance.   Dental Advisory Given  Plan Discussed with: CRNA and Surgeon  Anesthesia Plan Comments:         Anesthesia Quick Evaluation

## 2013-04-07 NOTE — Progress Notes (Signed)
Patient ID: Marisa Robinson, female   DOB: August 28, 1955, 57 y.o.   MRN: [id number]  Pt seen and examined  Plan lap chole with cholangiogram today.  I discussed this with her in detail.  I discussed the risks which include but are not limited to bleeding, infection, bile duct injury, bile leak, need to convert to an open procedure, etc.  She agrees to proceed.

## 2013-04-07 NOTE — Op Note (Signed)
Laparoscopic Cholecystectomy with IOC Procedure Note  Indications: This patient presents with symptomatic gallbladder disease and will undergo laparoscopic cholecystectomy.  Pre-operative Diagnosis: Calculus of gallbladder with acute cholecystitis, without mention of obstruction  Post-operative Diagnosis: Same  Surgeon: Abigail Miyamoto A   Assistants: 0  Anesthesia: General endotracheal anesthesia  ASA Class: 2  Procedure Details  The patient was seen again in the Holding Room. The risks, benefits, complications, treatment options, and expected outcomes were discussed with the patient. The possibilities of reaction to medication, pulmonary aspiration, perforation of viscus, bleeding, recurrent infection, finding a normal gallbladder, the need for additional procedures, failure to diagnose a condition, the possible need to convert to an open procedure, and creating a complication requiring transfusion or operation were discussed with the patient. The likelihood of improving the patient's symptoms with return to their baseline status is good.  The patient and/or family concurred with the proposed plan, giving informed consent. The site of surgery properly noted. The patient was taken to Operating Room, identified as Marisa Robinson and the procedure verified as Laparoscopic Cholecystectomy with Intraoperative Cholangiogram. A Time Out was held and the above information confirmed.  Prior to the induction of general anesthesia, antibiotic prophylaxis was administered. General endotracheal anesthesia was then administered and tolerated well. After the induction, the abdomen was prepped with Chloraprep and draped in the sterile fashion. The patient was positioned in the supine position.  Local anesthetic agent was injected into the skin near the umbilicus and an incision made. We dissected down to the abdominal fascia with blunt dissection.  The fascia was incised vertically and we entered the  peritoneal cavity bluntly.  A pursestring suture of 0-Vicryl was placed around the fascial opening.  The Hasson cannula was inserted and secured with the stay suture.  Pneumoperitoneum was then created with CO2 and tolerated well without any adverse changes in the patient's vital signs. An 11-mm port was placed in the subxiphoid position.  Two 5-mm ports were placed in the right upper quadrant. All skin incisions were infiltrated with a local anesthetic agent before making the incision and placing the trocars.   We positioned the patient in reverse Trendelenburg, tilted slightly to the patient's left.  The gallbladder was identified And found to be acutely inflamed and distended. I had to needle aspirate bile from the gallbladder in order to facilitate grasping the gallbladder. The patient appeared to have hydrops of the gallbladder. Once I relieve the gallbladder distention, I was able to grasp it and retracted above the liver bed. Adhesions were lysed bluntly and with the electrocautery where indicated, taking care not to injure any adjacent organs or viscus. The infundibulum was grasped and retracted laterally, exposing the peritoneum overlying the triangle of Calot. This was then divided and exposed in a blunt fashion. A critical view of the cystic duct and cystic artery was obtained.  The cystic duct was clearly identified and bluntly dissected circumferentially. The cystic duct was ligated with a clip distally.   An incision was made in the cystic duct and the [facility] cholangiogram catheter introduced. The catheter was secured using a clip. A cholangiogram was then obtained which showed good visualization of the distal and proximal biliary tree with no sign of filling defects or obstruction.  Contrast flowed easily into the duodenum. The catheter was then removed.   The cystic duct was then ligated with clips and divided. The cystic artery was identified, dissected free, ligated with clips and divided as  well.   The gallbladder was  dissected from the liver bed in retrograde fashion with the electrocautery. The gallbladder was removed and placed in an Endocatch sac. The liver bed was irrigated and inspected. Hemostasis was achieved with the electrocautery. Copious irrigation was utilized and was repeatedly aspirated until clear.  The gallbladder and Endocatch sac were then removed through the umbilical port site.  The pursestring suture was used to close the umbilical fascia.    We again inspected the right upper quadrant for hemostasis.  Pneumoperitoneum was released as we removed the trocars.  4-0 Monocryl was used to close the skin.   Benzoin, steri-strips, and clean dressings were applied. The patient was then extubated and brought to the recovery room in stable condition. Instrument, sponge, and needle counts were correct at closure and at the conclusion of the case.   Findings: Cholecystitis with Cholelithiasis  Estimated Blood Loss: Minimal         Drains: 0         Specimens: Gallbladder           Complications: None; patient tolerated the procedure well.         Disposition: PACU - hemodynamically stable.         Condition: stable

## 2013-04-07 NOTE — Progress Notes (Signed)
UR COMPLETED  

## 2013-04-07 NOTE — Progress Notes (Signed)
  Subjective: C/o RUQ pain, pain meds helping.  Denies n/v.  Denies chest pains shortness of breath or palpitations.    Objective: Vital signs in last 24 hours: Temp:  [97.8 F (36.6 C)-98.4 F (36.9 C)] 98.4 F (36.9 C) (11/04 0528) Pulse Rate:  [49-72] 60 (11/04 0528) Resp:  [16-18] 18 (11/04 0528) BP: (135-177)/(69-90) 148/72 mmHg (11/04 0528) SpO2:  [90 %-100 %] 97 % (11/04 0528) Weight:  [221 lb 14.4 oz (100.653 kg)] 221 lb 14.4 oz (100.653 kg) (11/03 1445)    Intake/Output from previous day:   Intake/Output this shift:    General appearance: alert, cooperative, appears stated age and no distress Resp: clear to auscultation bilaterally Cardio: regular rate and rhythm, S1, S2 normal, no murmur, click, rub or gallop GI: +bs, soft round, tender to RUQ Extremities: extremities normal, atraumatic, no cyanosis or edema  Lab Results:   Recent Labs  04/06/13 1455  WBC 5.9  HGB 11.3*  HCT 34.7*  PLT 276   BMET  Recent Labs  04/06/13 1455  NA 137  K 3.7  CL 100  CO2 27  GLUCOSE 110*  BUN 18  CREATININE 0.93  CALCIUM 9.6   PT/INR No results found for this basename: LABPROT, INR,  in the last 72 hours ABG No results found for this basename: PHART, PCO2, PO2, HCO3,  in the last 72 hours  Studies/Results: US Abdomen Complete  04/06/2013   CLINICAL DATA:  Right upper quadrant abdominal pain.  EXAM: ULTRASOUND ABDOMEN COMPLETE  COMPARISON:  None.  FINDINGS: Gallbladder  Numerous echogenic gallstones are noted in the gallbladder with associated acoustic shadowing. No gallbladder wall thickening or pericholecystic fluid but there is a positive sonographic Murphy sign.  Common bile duct  Diameter: Dilated at 8.8 mm. No definite common bile duct stones.  Liver  Normal echogenicity without focal lesions. Mild central intrahepatic biliary dilatation.  IVC  Normal caliber.  Pancreas  Sonographically normal.  Spleen  Normal size and echogenicity without focal lesions.  Right  Kidney  Length: 10.3 cm. Echogenicity within normal limits. No mass or hydronephrosis visualized.  Left Kidney  Length: 10.6 cm. Echogenicity within normal limits. No mass or hydronephrosis visualized.  Abdominal aorta  Normal caliber.  IMPRESSION: Chololithiasis and positive sonographic Murphy sign and mild biliary dilatation. Findings suggest cholecystitis.  The remainder of the examination is unremarkable.   Electronically Signed   By: Loralie Champagne M.D.   On: 04/06/2013 19:19    Anti-infectives: Anti-infectives   Start     Dose/Rate Route Frequency Ordered Stop   04/06/13 2200  Ampicillin-Sulbactam (UNASYN) 3 g in sodium chloride 0.9 % 100 mL IVPB     3 g 100 mL/hr over 60 Minutes Intravenous Every 6 hours 04/06/13 2138        Assessment/Plan: Acute cholecystitis  -proceed with laparoscopic cholecystectomy.  We discussed risks and complications of the surgery including but not limited to bleeding, infection, injury to bowel, bile duct, heart attack and death.  She verbalizes understandig and wishes to proceed. -obtain consent -resume heparin tonight at 2200 -start home meds -SCDs -NPO -unasyn q6h -IVF   LOS: 1 day    Riham Polyakov ANP-BC 04/07/2013

## 2013-04-07 NOTE — Transfer of Care (Signed)
Immediate Anesthesia Transfer of Care Note  Patient: Marisa Robinson  Procedure(s) Performed: Procedure(s): LAPAROSCOPIC CHOLECYSTECTOMY WITH INTRAOPERATIVE CHOLANGIOGRAM (N/A)  Patient Location: PACU  Anesthesia Type:General  Level of Consciousness: lethargic and responds to stimulation  Airway & Oxygen Therapy: Patient Spontanous Breathing and Patient connected to face mask oxygen  Post-op Assessment: Report given to PACU RN  Post vital signs: Reviewed and stable  Complications: No apparent anesthesia complications

## 2013-04-07 NOTE — Anesthesia Postprocedure Evaluation (Signed)
Anesthesia Post Note  Patient: Marisa Robinson  Procedure(s) Performed: Procedure(s) (LRB): LAPAROSCOPIC CHOLECYSTECTOMY WITH INTRAOPERATIVE CHOLANGIOGRAM (N/A)  Anesthesia type: General  Patient location: PACU  Post pain: Pain level controlled  Post assessment: Patient's Cardiovascular Status Stable  Last Vitals:  Filed Vitals:   04/07/13 1130  BP: 144/68  Pulse: 52  Temp:   Resp: 10    Post vital signs: Reviewed and stable  Level of consciousness: alert  Complications: No apparent anesthesia complications

## 2013-04-07 NOTE — Anesthesia Procedure Notes (Signed)
Procedure Name: Intubation Date/Time: 04/07/2013 9:33 AM Performed by: De Nurse Pre-anesthesia Checklist: Patient identified, Emergency Drugs available, Suction available, Patient being monitored and Timeout performed Patient Re-evaluated:Patient Re-evaluated prior to inductionOxygen Delivery Method: Circle system utilized Preoxygenation: Pre-oxygenation with 100% oxygen Intubation Type: IV induction Ventilation: Mask ventilation without difficulty and Oral airway inserted - appropriate to patient size Laryngoscope Size: Mac and 3 Grade View: Grade II Tube type: Oral Tube size: 7.5 mm Number of attempts: 1 Airway Equipment and Method: Stylet Placement Confirmation: ETT inserted through vocal cords under direct vision,  positive ETCO2 and breath sounds checked- equal and bilateral Secured at: 21 cm Tube secured with: Tape Dental Injury: Teeth and Oropharynx as per pre-operative assessment

## 2013-04-07 NOTE — Preoperative (Signed)
Beta Blockers   Reason not to administer Beta Blockers:Not Applicable 

## 2013-04-08 ENCOUNTER — Encounter: Payer: Self-pay | Admitting: Internal Medicine

## 2013-04-08 MED ORDER — OXYCODONE-ACETAMINOPHEN 5-325 MG PO TABS: 1.0000 | ORAL_TABLET | ORAL | Status: DC | PRN

## 2013-04-08 MED ORDER — CIPROFLOXACIN HCL 500 MG PO TABS: 500.0000 mg | ORAL_TABLET | Freq: Two times a day (BID) | ORAL | Status: DC

## 2013-04-08 MED ORDER — ONDANSETRON HCL 4 MG PO TABS: 4.0000 mg | ORAL_TABLET | Freq: Four times a day (QID) | ORAL | Status: DC | PRN

## 2013-04-08 NOTE — Discharge Summary (Signed)
I have seen and examined the patient and agree with the assessment and plans.  Marisa Robinson A. Raylyn Carton  MD, FACS  

## 2013-04-08 NOTE — Discharge Summary (Signed)
Patient ID: Marisa Robinson MRN: [id number] DOB/AGE: 58/27/1957 58 y.o.  Admit date: 04/06/2013 Discharge date: 04/08/2013  Procedures: laparoscopic cholecystectomy with IOC  Consults: None  Reason for Admission: 71 yof referred by Dr Chaney Malling who presents with <24 hours of ruq pain, back pain primarily on right and n/v. She had prior episode for which she saw one of my partners in July and was recommended cholecystectomy. She has not had any further episodes until this one. She has not been able to eat. Denies fevers. Nothing is helping this pain now except for dilaudid she just received. She saw Dr Rhea Belton recently to discuss a screening colonoscopy also.  She presents to er due to unrelenting pain.  Admission Diagnoses:  1. Acute cholecystitis  Hospital Course: the patient was admitted and given abx therapy and pain control.  She was then taken to the operating room the following day where she underwent a lap chole.  She tolerated the procedure well.  On POD1, she had some slight nausea, secondary to her pain medication.  She began feeling better and her diet was tolerated and she was ok for dc home.  She will be dc home on a total of 7 days of an antibiotic.  PE: Abd: soft, appropriately tender, +BS, BD, incisions c/d/i  Discharge Diagnoses:  1. Acute cholecystitis, s/p lap chole  Discharge Medications:   Medication List         atorvastatin 80 MG tablet  Commonly known as:  LIPITOR  Take 80 mg by mouth daily.     ciprofloxacin 500 MG tablet  Commonly known as:  CIPRO  Take 1 tablet (500 mg total) by mouth 2 (two) times daily.     naproxen sodium 220 MG tablet  Commonly known as:  ANAPROX  Take 220 mg by mouth daily as needed (for pain).     olmesartan-hydrochlorothiazide 20-12.5 MG per tablet  Commonly known as:  BENICAR HCT  Take 1 tablet by mouth daily.     ondansetron 4 MG tablet  Commonly known as:  ZOFRAN  Take 1 tablet (4 mg total) by mouth every 6 (six) hours  as needed for nausea or vomiting.     oxyCODONE-acetaminophen 5-325 MG per tablet  Commonly known as:  PERCOCET/ROXICET  Take 1-2 tablets by mouth every 4 (four) hours as needed for moderate pain.        Discharge Instructions:     Follow-up Information   Follow up with [facility].   Contact information:   [address]   [place] [address] [phone]       Follow up On 04/28/2013. (2:15pm, arrive by 1:45pm for paperwork)       Signed: Lenwood Balsam E 04/08/2013, 9:17 AM

## 2013-04-09 ENCOUNTER — Encounter (HOSPITAL_COMMUNITY): Payer: Self-pay | Admitting: Surgery

## 2013-04-09 NOTE — Progress Notes (Signed)
2 Days Post-Op  Subjective: Feeling much better, ambulating, pain controlled  Objective: Vital signs in last 24 hours: Temp:  [98.4 F (36.9 C)-98.7 F (37.1 C)] 98.5 F (36.9 C) (11/06 0627) Pulse Rate:  [56-76] 56 (11/06 0627) Resp:  [16-18] 16 (11/06 0627) BP: (144-147)/(64-75) 144/64 mmHg (11/06 0627) SpO2:  [98 %-100 %] 98 % (11/06 0627) Last BM Date: 04/05/13  Intake/Output from previous day: 11/05 0701 - 11/06 0700 In: 3276.3 [P.O.:450; I.V.:1826.3; IV Piggyback:1000] Out: -  Intake/Output this shift:    Lungs clear Abdomen soft, dressings dry  Lab Results:   Recent Labs  04/06/13 1455  WBC 5.9  HGB 11.3*  HCT 34.7*  PLT 276   BMET  Recent Labs  04/06/13 1455 04/07/13 0500  NA 137 136  K 3.7 3.7  CL 100 101  CO2 27 24  GLUCOSE 110* 116*  BUN 18 14  CREATININE 0.93 0.76  CALCIUM 9.6 8.5   PT/INR No results found for this basename: LABPROT, INR,  in the last 72 hours ABG No results found for this basename: PHART, PCO2, PO2, HCO3,  in the last 72 hours  Studies/Results: Dg Cholangiogram Operative  04/07/2013   CLINICAL DATA:  Intraoperative cholangiogram performed following laparoscopic cholecystectomy.  EXAM: INTRAOPERATIVE CHOLANGIOGRAM  TECHNIQUE: Cholangiographic images from the C-arm fluoroscopic device were submitted for interpretation post-operatively. Please see the procedural report for the amount of contrast and the fluoroscopy time utilized.  COMPARISON:  Ultrasound, 04/06/2013  FINDINGS: The cystic duct remnant and the common bile duct are well opacified, as are branches of the intrahepatic biliary tree. There is no duct dilation. No filling defects are seen to suggest a retained duct stone. There is no contrast extravasation. Contrast freely flows into the duodenum.  IMPRESSION: Normal intraoperative cholangiogram. No evidence of a duct stone.   Electronically Signed   By: Amie Portland M.D.   On: 04/07/2013 11:45     Anti-infectives: Anti-infectives   Start     Dose/Rate Route Frequency Ordered Stop   04/08/13 0000  ciprofloxacin (CIPRO) 500 MG tablet     500 mg Oral 2 times daily 04/08/13 0900     04/06/13 2200  Ampicillin-Sulbactam (UNASYN) 3 g in sodium chloride 0.9 % 100 mL IVPB     3 g 100 mL/hr over 60 Minutes Intravenous Every 6 hours 04/06/13 2138        Assessment/Plan: s/p Procedure(s): LAPAROSCOPIC CHOLECYSTECTOMY WITH INTRAOPERATIVE CHOLANGIOGRAM (N/A)  discharge home  LOS: 3 days    Mysty Kielty A 04/09/2013

## 2013-04-28 ENCOUNTER — Encounter (INDEPENDENT_AMBULATORY_CARE_PROVIDER_SITE_OTHER): Payer: No Typology Code available for payment source

## 2013-04-28 ENCOUNTER — Encounter: Payer: No Typology Code available for payment source | Admitting: Internal Medicine

## 2013-05-19 ENCOUNTER — Encounter (INDEPENDENT_AMBULATORY_CARE_PROVIDER_SITE_OTHER): Payer: Self-pay | Admitting: General Surgery

## 2013-05-19 ENCOUNTER — Ambulatory Visit (INDEPENDENT_AMBULATORY_CARE_PROVIDER_SITE_OTHER): Payer: No Typology Code available for payment source | Admitting: General Surgery

## 2013-05-19 VITALS — BP 118/78 | HR 64 | Temp 98.1°F | Resp 14 | Ht 66.0 in | Wt 218.6 lb

## 2013-05-19 DIAGNOSIS — K8010 Calculus of gallbladder with chronic cholecystitis without obstruction: Secondary | ICD-10-CM

## 2013-05-19 NOTE — Progress Notes (Signed)
Marisa Robinson 1955-12-09 [id number] 05/19/2013   Marisa Robinson is a 57 y.o. female who had a laparoscopic cholecystectomy with intraoperative cholangiogram by Dr. Shelly Rubenstein, MD  The pathology report confirmed Gallbladder- CHRONIC CHOLECYSTITIS AND CHOLELITHIASIS.  The patient reports that they are feeling well with normal bowel movements and good appetite.  The pre-operative symptoms of abdominal pain, nausea, and vomiting have resolved.    Physical examination: BP 118/78  Pulse 64  Temp(Src) 98.1 F (36.7 C) (Temporal)  Resp 14  Ht 5\' 6"  (1.676 m)  Wt 99.156 kg (218 lb 9.6 oz)  BMI 35.30 kg/m2    - Incisions appear well-healed with no sign of infection or bleeding.   Abdomen - soft, non-tender  Impression:  s/p laparoscopic cholecystectomy  Plan:  She may resume a regular diet and full activity.  She may follow-up on a PRN basis.

## 2013-05-19 NOTE — Patient Instructions (Signed)
Call if you need us! 

## 2013-05-27 ENCOUNTER — Ambulatory Visit: Payer: No Typology Code available for payment source | Admitting: Internal Medicine

## 2013-06-19 ENCOUNTER — Ambulatory Visit (AMBULATORY_SURGERY_CENTER): Payer: Self-pay

## 2013-06-19 VITALS — Ht 66.0 in | Wt 218.0 lb

## 2013-06-19 DIAGNOSIS — Z1211 Encounter for screening for malignant neoplasm of colon: Secondary | ICD-10-CM

## 2013-06-23 ENCOUNTER — Encounter: Payer: Self-pay | Admitting: Internal Medicine

## 2013-06-30 ENCOUNTER — Other Ambulatory Visit: Payer: Self-pay | Admitting: Internal Medicine

## 2013-07-03 ENCOUNTER — Encounter: Payer: Self-pay | Admitting: Internal Medicine

## 2013-07-03 ENCOUNTER — Ambulatory Visit (AMBULATORY_SURGERY_CENTER): Payer: No Typology Code available for payment source | Admitting: Internal Medicine

## 2013-07-03 VITALS — BP 123/67 | HR 56 | Temp 98.6°F | Resp 16 | Ht 66.0 in | Wt 218.0 lb

## 2013-07-03 DIAGNOSIS — D126 Benign neoplasm of colon, unspecified: Secondary | ICD-10-CM

## 2013-07-03 DIAGNOSIS — Z1211 Encounter for screening for malignant neoplasm of colon: Secondary | ICD-10-CM

## 2013-07-03 MED ORDER — SODIUM CHLORIDE 0.9 % IV SOLN: 500.0000 mL | INTRAVENOUS | Status: DC

## 2013-07-03 NOTE — Patient Instructions (Signed)
Polyp information sheet given.  YOU HAD AN ENDOSCOPIC PROCEDURE TODAY AT [facility]: Refer to the procedure report that was given to you for any specific questions about what was found during the examination.  If the procedure report does not answer your questions, please call your gastroenterologist to clarify.  If you requested that your care partner not be given the details of your procedure findings, then the procedure report has been included in a sealed envelope for you to review at your convenience later.  YOU SHOULD EXPECT: Some feelings of bloating in the abdomen. Passage of more gas than usual.  Walking can help get rid of the air that was put into your GI tract during the procedure and reduce the bloating. If you had a lower endoscopy (such as a colonoscopy or flexible sigmoidoscopy) you may notice spotting of blood in your stool or on the toilet paper. If you underwent a bowel prep for your procedure, then you may not have a normal bowel movement for a few days.  DIET: Your first meal following the procedure should be a light meal and then it is ok to progress to your normal diet.  A half-sandwich or bowl of soup is an example of a good first meal.  Heavy or fried foods are harder to digest and may make you feel nauseous or bloated.  Likewise meals heavy in dairy and vegetables can cause extra gas to form and this can also increase the bloating.  Drink plenty of fluids but you should avoid alcoholic beverages for 24 hours.  ACTIVITY: Your care partner should take you home directly after the procedure.  You should plan to take it easy, moving slowly for the rest of the day.  You can resume normal activity the day after the procedure however you should NOT DRIVE or use heavy machinery for 24 hours (because of the sedation medicines used during the test).    SYMPTOMS TO REPORT IMMEDIATELY: A gastroenterologist can be reached at any hour.  During normal business hours, 8:30 AM to  5:00 PM Monday through Friday, call [phone].  After hours and on weekends, please call the GI answering service at [phone] who will take a message and have the physician on call contact you.   Following lower endoscopy (colonoscopy or flexible sigmoidoscopy):  Excessive amounts of blood in the stool  Significant tenderness or worsening of abdominal pains  Swelling of the abdomen that is new, acute  Fever of 100F or higher FOLLOW UP: If any biopsies were taken you will be contacted by phone or by letter within the next 1-3 weeks.  Call your gastroenterologist if you have not heard about the biopsies in 3 weeks.  Our staff will call the home number listed on your records the next business day following your procedure to check on you and address any questions or concerns that you may have at that time regarding the information given to you following your procedure. This is a courtesy call and so if there is no answer at the home number and we have not heard from you through the emergency physician on call, we will assume that you have returned to your regular daily activities without incident.  SIGNATURES/CONFIDENTIALITY: You and/or your care partner have signed paperwork which will be entered into your electronic medical record.  These signatures attest to the fact that that the information above on your After Visit Summary has been reviewed and is understood.  Full responsibility of  the confidentiality of this discharge information lies with you and/or your care-partner. 

## 2013-07-03 NOTE — Progress Notes (Signed)
Called to room to assist during endoscopic procedure.  Patient ID and intended procedure confirmed with present staff. Received instructions for my participation in the procedure from the performing physician.  

## 2013-07-03 NOTE — Op Note (Signed)
[facility]  [address] [place], [address]   COLONOSCOPY PROCEDURE REPORT  PATIENT: Marisa Robinson, Marisa Robinson  MR#: [id number] BIRTHDATE: 1955/12/04 , 84  yrs. old GENDER: Female ENDOSCOPIST: Jerene Bears, MD PROCEDURE DATE:  07/03/2013 PROCEDURE:   Colonoscopy with cold biopsy polypectomy First Screening Colonoscopy - Avg.  risk and is 50 yrs.  old or older Yes.  Prior Negative Screening - Now for repeat screening. N/A  History of Adenoma - Now for follow-up colonoscopy & has been > or = to 3 yrs.  N/A  Polyps Removed Today? Yes. ASA CLASS:   Class II INDICATIONS:average risk screening and first colonoscopy. MEDICATIONS: MAC sedation, administered by CRNA and propofol (Diprivan) 350mg  IV  DESCRIPTION OF PROCEDURE:   After the risks benefits and alternatives of the procedure were thoroughly explained, informed consent was obtained.  A digital rectal exam revealed several skin tags.   The LB [id number]  endoscope was introduced through the anus and advanced to the cecum, which was identified by both the appendix and ileocecal valve. No adverse events experienced. The quality of the prep was good, using MoviPrep  The instrument was then slowly withdrawn as the colon was fully examined.   COLON FINDINGS: A sessile polyp measuring 5 mm in size was found at the appendiceal orifice.  A polypectomy was performed with cold forceps.  The resection was complete and the polyp tissue was completely retrieved.   The colon mucosa was otherwise normal. Retroflexed views revealed no abnormalities. The time to cecum=5 minutes 59 seconds.  Withdrawal time=14 minutes 41 seconds.  The scope was withdrawn and the procedure completed. COMPLICATIONS: There were no complications.  ENDOSCOPIC IMPRESSION: 1.   Sessile polyp measuring 5 mm in size was found at the appendiceal orifice; polypectomy was performed with cold forceps 2.   The colon mucosa was otherwise  normal  RECOMMENDATIONS: 1.  Await biopsy results 2.  If the polyp removed today is proven to be an adenomatous (pre-cancerous) polyp, you will need a repeat colonoscopy in 5 years.  Otherwise you should continue to follow colorectal cancer screening guidelines for "routine risk" patients with colonoscopy in 10 years.  You will receive a letter within 1-2 weeks with the results of your biopsy as well as final recommendations.  Please call my office if you have not received a letter after 3 weeks. 3.   If anemia persists, then hematology referral   eSigned:  Jerene Bears, MD 07/03/2013 2:05 PM  cc: The Patient and Janith Lima, MD

## 2013-07-06 ENCOUNTER — Telehealth: Payer: Self-pay | Admitting: *Deleted

## 2013-07-06 NOTE — Telephone Encounter (Signed)
  Follow up Call-  Call back number 07/03/2013  Post procedure Call Back phone  # [phone]  Permission to leave phone message Yes     Patient questions:  Do you have a fever, pain , or abdominal swelling? no Pain Score  0 *  Have you tolerated food without any problems? yes  Have you been able to return to your normal activities? yes  Do you have any questions about your discharge instructions: Diet   no Medications  no Follow up visit  no  Do you have questions or concerns about your Care? no  Actions: * If pain score is 4 or above: No action needed, pain <4.

## 2013-07-12 ENCOUNTER — Encounter: Payer: Self-pay | Admitting: Internal Medicine

## 2013-07-13 LAB — HM COLONOSCOPY

## 2013-08-09 ENCOUNTER — Other Ambulatory Visit: Payer: Self-pay | Admitting: Internal Medicine

## 2013-12-16 ENCOUNTER — Telehealth: Payer: Self-pay

## 2013-12-16 NOTE — Telephone Encounter (Signed)
Left message for pt to schedule CPE with PCP 

## 2014-02-15 ENCOUNTER — Encounter: Payer: No Typology Code available for payment source | Admitting: Internal Medicine

## 2014-02-15 DIAGNOSIS — Z0289 Encounter for other administrative examinations: Secondary | ICD-10-CM

## 2014-02-24 ENCOUNTER — Encounter (HOSPITAL_COMMUNITY): Payer: Self-pay | Admitting: Emergency Medicine

## 2014-02-24 ENCOUNTER — Emergency Department (HOSPITAL_COMMUNITY)
Admission: EM | Admit: 2014-02-24 | Discharge: 2014-02-24 | Disposition: A | Discharge status: Home / Self Care | Payer: 59 | Attending: Emergency Medicine | Admitting: Emergency Medicine

## 2014-02-24 DIAGNOSIS — K047 Periapical abscess without sinus: Secondary | ICD-10-CM | POA: Diagnosis not present

## 2014-02-24 DIAGNOSIS — I10 Essential (primary) hypertension: Secondary | ICD-10-CM | POA: Insufficient documentation

## 2014-02-24 DIAGNOSIS — K089 Disorder of teeth and supporting structures, unspecified: Secondary | ICD-10-CM | POA: Diagnosis present

## 2014-02-24 DIAGNOSIS — Z8639 Personal history of other endocrine, nutritional and metabolic disease: Secondary | ICD-10-CM | POA: Insufficient documentation

## 2014-02-24 DIAGNOSIS — Z87440 Personal history of urinary (tract) infections: Secondary | ICD-10-CM | POA: Diagnosis not present

## 2014-02-24 DIAGNOSIS — Z87442 Personal history of urinary calculi: Secondary | ICD-10-CM | POA: Insufficient documentation

## 2014-02-24 DIAGNOSIS — Z8719 Personal history of other diseases of the digestive system: Secondary | ICD-10-CM | POA: Insufficient documentation

## 2014-02-24 DIAGNOSIS — Z862 Personal history of diseases of the blood and blood-forming organs and certain disorders involving the immune mechanism: Secondary | ICD-10-CM | POA: Insufficient documentation

## 2014-02-24 MED ORDER — PENICILLIN V POTASSIUM 500 MG PO TABS: 500.0000 mg | ORAL_TABLET | Freq: Four times a day (QID) | ORAL | Status: DC

## 2014-02-24 MED ORDER — TRAMADOL HCL 50 MG PO TABS: 50.0000 mg | ORAL_TABLET | Freq: Four times a day (QID) | ORAL | Status: DC | PRN

## 2014-02-24 NOTE — ED Notes (Signed)
C/o right lower dental pain. Onset yesterday.

## 2014-02-24 NOTE — Discharge Instructions (Signed)
Take the prescribed medication as directed. Follow-up with dentist-- may find your own or follow-up with one of the ones i have listed. Return to the ED for new or worsening symptoms.

## 2014-02-24 NOTE — ED Provider Notes (Signed)
CSN: [id number]     Arrival date & time 02/24/14  1321 History  This chart was scribed for non-physician practitioner, Quincy Carnes, PA-C working with Francine Graven, DO by Frederich Balding, ED scribe. This patient was seen in room TR11C/TR11C and the patient's care was started at 3:19 PM.   Chief Complaint  Patient presents with  . Dental Pain   The history is provided by the patient. No language interpreter was used.   HPI Comments: Marisa Robinson is a 58 y.o. female who presents to the Emergency Department complaining of gradual onset right lower dental pain with associated mild facial swelling that started this morning. Pressure worsens the pain. No changes in medications or diet to cause reaction.  Pt has not taken anything for her symptoms. Denies fever, trouble swallowing, difficulty breathing. Pt does not have a dentist. Denies history of dental abscesses.  VS stable on arrival.  Past Medical History  Diagnosis Date  . Urinary tract infection   . Anemia   . Thyroid disease   . Hyperlipidemia   . History of kidney stones   . Gallstones   . Hypertension   . Hypothyroidism    Past Surgical History  Procedure Laterality Date  . Etopic surgery  1985, 1987  . Cholecystectomy  04/07/2013  . Cholecystectomy N/A 04/07/2013    Procedure: LAPAROSCOPIC CHOLECYSTECTOMY WITH INTRAOPERATIVE CHOLANGIOGRAM;  Surgeon: Harl Bowie, MD;  Location: [place];  Service: General;  Laterality: N/A;   Family History  Problem Relation Age of Onset  . Diabetes Father   . Heart disease Father   . Hypertension Sister   . Hypertension Brother   . Cancer Neg Hx   . Early death Neg Hx   . Hyperlipidemia Neg Hx   . Kidney disease Neg Hx   . Stroke Neg Hx   . Colon cancer Neg Hx    History  Substance Use Topics  . Smoking status: Never Smoker   . Smokeless tobacco: Never Used  . Alcohol Use: No     Comment: occasional, 2 x per year   OB History   Grav Para Term Preterm Abortions TAB SAB  Ect Mult Living                 Review of Systems  Constitutional: Negative for fever.  HENT: Positive for dental problem and facial swelling. Negative for trouble swallowing.   All other systems reviewed and are negative.  Allergies  Review of patient's allergies indicates no known allergies.  Home Medications   Prior to Admission medications   Not on File   BP 179/91  Pulse 68  Temp(Src) 98.6 F (37 C) (Oral)  SpO2 100%  Physical Exam  Nursing note and vitals reviewed. Constitutional: She is oriented to person, place, and time. She appears well-developed and well-nourished.  HENT:  Head: Normocephalic and atraumatic.  Mouth/Throat: Uvula is midline, oropharynx is clear and moist and mucous membranes are normal. Abnormal dentition. Dental abscesses and dental caries present. No oropharyngeal exudate, posterior oropharyngeal edema, posterior oropharyngeal erythema or tonsillar abscesses.  Teeth largely in poor dentition, right lower pre-molar with large cavity and severe dental decay, surrounding gingiva swollen and erythematous consistent with dental abscess, handling secretions appropriately, no trismus; no neck or facial swelling  Eyes: Conjunctivae and EOM are normal. Pupils are equal, round, and reactive to light.  Neck: Normal range of motion. Neck supple.  Cardiovascular: Normal rate, regular rhythm and normal heart sounds.   Pulmonary/Chest: Effort normal and  breath sounds normal. No respiratory distress. She has no wheezes.  Musculoskeletal: Normal range of motion.  Neurological: She is alert and oriented to person, place, and time.  Skin: Skin is warm and dry.  Psychiatric: She has a normal mood and affect.    ED Course  Procedures (including critical care time)  DIAGNOSTIC STUDIES: Oxygen Saturation is 100% on RA, normal by my interpretation.    COORDINATION OF CARE: 3:21 PM-Discussed treatment plan which includes an antibiotic and pain medication with pt at  bedside and pt agreed to plan. Will give pt dental referrals and advised her to follow up.   Labs Review Labs Reviewed - No data to display  Imaging Review No results found.   EKG Interpretation None      MDM   Final diagnoses:  Dental abscess   Dental pain due to dental abscess.  No airway compromise or neck swelling, handling secretions well.  Patient afebrile and non-toxic.  Will start on penicillin and tramadol.  Encouraged FU with dentist.  Discussed plan with patient, he/she acknowledged understanding and agreed with plan of care.  Return precautions given for new or worsening symptoms.  I personally performed the services described in this documentation, which was scribed in my presence. The recorded information has been reviewed and is accurate.  Larene Pickett, PA-C 02/24/14 [phone]

## 2014-02-24 NOTE — ED Provider Notes (Signed)
Medical screening examination/treatment/procedure(s) were performed by non-physician practitioner and as supervising physician I was immediately available for consultation/collaboration.   EKG Interpretation None        Francine Graven, DO 02/24/14 1821

## 2014-03-08 ENCOUNTER — Emergency Department (HOSPITAL_COMMUNITY)
Admission: EM | Admit: 2014-03-08 | Discharge: 2014-03-08 | Disposition: A | Discharge status: Home / Self Care | Payer: 59 | Attending: Emergency Medicine | Admitting: Emergency Medicine

## 2014-03-08 ENCOUNTER — Encounter (HOSPITAL_COMMUNITY): Payer: Self-pay | Admitting: Emergency Medicine

## 2014-03-08 DIAGNOSIS — I10 Essential (primary) hypertension: Secondary | ICD-10-CM | POA: Insufficient documentation

## 2014-03-08 DIAGNOSIS — K529 Noninfective gastroenteritis and colitis, unspecified: Secondary | ICD-10-CM | POA: Insufficient documentation

## 2014-03-08 DIAGNOSIS — Z87440 Personal history of urinary (tract) infections: Secondary | ICD-10-CM | POA: Diagnosis not present

## 2014-03-08 DIAGNOSIS — Z862 Personal history of diseases of the blood and blood-forming organs and certain disorders involving the immune mechanism: Secondary | ICD-10-CM | POA: Diagnosis not present

## 2014-03-08 DIAGNOSIS — Z8639 Personal history of other endocrine, nutritional and metabolic disease: Secondary | ICD-10-CM | POA: Insufficient documentation

## 2014-03-08 DIAGNOSIS — R112 Nausea with vomiting, unspecified: Secondary | ICD-10-CM | POA: Insufficient documentation

## 2014-03-08 DIAGNOSIS — Z791 Long term (current) use of non-steroidal anti-inflammatories (NSAID): Secondary | ICD-10-CM | POA: Diagnosis not present

## 2014-03-08 DIAGNOSIS — R1084 Generalized abdominal pain: Secondary | ICD-10-CM | POA: Diagnosis present

## 2014-03-08 DIAGNOSIS — Z792 Long term (current) use of antibiotics: Secondary | ICD-10-CM | POA: Diagnosis not present

## 2014-03-08 DIAGNOSIS — Z87442 Personal history of urinary calculi: Secondary | ICD-10-CM | POA: Diagnosis not present

## 2014-03-08 DIAGNOSIS — Z9049 Acquired absence of other specified parts of digestive tract: Secondary | ICD-10-CM | POA: Insufficient documentation

## 2014-03-08 LAB — CBC WITH DIFFERENTIAL/PLATELET
BASOS ABS: 0 10*3/uL (ref 0.0–0.1)
Basophils Relative: 0 % (ref 0–1)
EOS ABS: 0.1 10*3/uL (ref 0.0–0.7)
Eosinophils Relative: 3 % (ref 0–5)
HEMATOCRIT: 38.3 % (ref 36.0–46.0)
Hemoglobin: 12.2 g/dL (ref 12.0–15.0)
LYMPHS ABS: 1.2 10*3/uL (ref 0.7–4.0)
Lymphocytes Relative: 27 % (ref 12–46)
MCH: 23.4 pg — ABNORMAL LOW (ref 26.0–34.0)
MCHC: 31.9 g/dL (ref 30.0–36.0)
MCV: 73.5 fL — AB (ref 78.0–100.0)
Monocytes Absolute: 0.2 10*3/uL (ref 0.1–1.0)
Monocytes Relative: 5 % (ref 3–12)
NEUTROS ABS: 2.9 10*3/uL (ref 1.7–7.7)
Neutrophils Relative %: 65 % (ref 43–77)
Platelets: 281 10*3/uL (ref 150–400)
RBC: 5.21 MIL/uL — ABNORMAL HIGH (ref 3.87–5.11)
RDW: 14.8 % (ref 11.5–15.5)
WBC: 4.4 10*3/uL (ref 4.0–10.5)

## 2014-03-08 LAB — URINALYSIS, ROUTINE W REFLEX MICROSCOPIC
Glucose, UA: NEGATIVE mg/dL
Hgb urine dipstick: NEGATIVE
KETONES UR: NEGATIVE mg/dL
Leukocytes, UA: NEGATIVE
NITRITE: NEGATIVE
PH: 5 (ref 5.0–8.0)
Protein, ur: NEGATIVE mg/dL
SPECIFIC GRAVITY, URINE: 1.028 (ref 1.005–1.030)
Urobilinogen, UA: 0.2 mg/dL (ref 0.0–1.0)

## 2014-03-08 LAB — COMPREHENSIVE METABOLIC PANEL
ALBUMIN: 4 g/dL (ref 3.5–5.2)
ALT: 8 U/L (ref 0–35)
AST: 17 U/L (ref 0–37)
Alkaline Phosphatase: 85 U/L (ref 39–117)
Anion gap: 11 (ref 5–15)
BUN: 16 mg/dL (ref 6–23)
CALCIUM: 8.9 mg/dL (ref 8.4–10.5)
CO2: 24 mEq/L (ref 19–32)
CREATININE: 1.06 mg/dL (ref 0.50–1.10)
Chloride: 104 mEq/L (ref 96–112)
GFR calc Af Amer: 65 mL/min — ABNORMAL LOW (ref >90)
GFR calc non Af Amer: 56 mL/min — ABNORMAL LOW (ref >90)
Glucose, Bld: 107 mg/dL — ABNORMAL HIGH (ref 70–99)
Potassium: 4.1 mEq/L (ref 3.7–5.3)
Sodium: 139 mEq/L (ref 137–147)
Total Bilirubin: 0.5 mg/dL (ref 0.3–1.2)
Total Protein: 8 g/dL (ref 6.0–8.3)

## 2014-03-08 LAB — LIPASE, BLOOD: LIPASE: 18 U/L (ref 11–59)

## 2014-03-08 MED ORDER — SODIUM CHLORIDE 0.9 % IV BOLUS (SEPSIS)
1000.0000 mL | Freq: Once | INTRAVENOUS | Status: AC
  Administered 2014-03-08: 1000 mL via INTRAVENOUS

## 2014-03-08 MED ORDER — ONDANSETRON HCL 4 MG/2ML IJ SOLN
4.0000 mg | Freq: Once | INTRAMUSCULAR | Status: AC
  Administered 2014-03-08: 4 mg via INTRAVENOUS
  Filled 2014-03-08: qty 2

## 2014-03-08 MED ORDER — ONDANSETRON HCL 4 MG PO TABS: 4.0000 mg | ORAL_TABLET | Freq: Four times a day (QID) | ORAL | Status: DC

## 2014-03-08 NOTE — ED Notes (Signed)
Pt c/o abdominal pain with N/V/D onset yesterday morning. Pt denies change in diet or exposure to others with illness.

## 2014-03-08 NOTE — ED Provider Notes (Signed)
Medical screening examination/treatment/procedure(s) were performed by non-physician practitioner and as supervising physician I was immediately available for consultation/collaboration.   EKG Interpretation None       Veryl Speak, MD 03/08/14 1643

## 2014-03-08 NOTE — ED Notes (Signed)
Pt requesting a school note for class today, told we will be happy to print one out when she is discharged.

## 2014-03-08 NOTE — Discharge Instructions (Signed)

## 2014-03-08 NOTE — ED Provider Notes (Signed)
CSN: [id number]     Arrival date & time 03/08/14  0908 History   First MD Initiated Contact with Patient 03/08/14 [phone]     Chief Complaint  Patient presents with  . Abdominal Pain  . Emesis  . Diarrhea   Patient is a 58 y.o. female presenting with abdominal pain, vomiting, and diarrhea.  Abdominal Pain Associated symptoms: diarrhea, nausea and vomiting   Associated symptoms: no chest pain, no chills, no constipation, no cough, no dysuria, no fatigue, no fever, no hematuria, no shortness of breath, no vaginal bleeding and no vaginal discharge   Emesis Associated symptoms: abdominal pain and diarrhea   Associated symptoms: no chills   Diarrhea Associated symptoms: abdominal pain and vomiting   Associated symptoms: no chills and no fever     Patient is a 58 y.o. Female who presents to the ED 24 hours of nausea, vomiting, and diarrhea.  Patient states that at 10:00 am yesterday she developed diarrhea which was shortly followed by nausea and vomiting.  Patient states that she has had 5 episodes of vomiting and 6 episodes of clear watery diarrhea.  Patient does admit to some generalized crampy abdominal pain.    Patient states that she does not have any sick contacts, but her daughter who was taking care of her yesterday now states that she is having similar symptoms.  Patient denies fever, chills, chest pain, shortness of breath, melena, hematochezia, hematemesis, urinary symptoms, vaginal complaints, or rash.  Patient denies any suspicious food intake, stomach trauma, recent travel, or sick contacts.  Of note patient does live with her grandchildren who are in school.     Past Medical History  Diagnosis Date  . Urinary tract infection   . Anemia   . Thyroid disease   . Hyperlipidemia   . History of kidney stones   . Gallstones   . Hypertension   . Hypothyroidism    Past Surgical History  Procedure Laterality Date  . Etopic surgery  1985, 1987  . Cholecystectomy  04/07/2013  .  Cholecystectomy N/A 04/07/2013    Procedure: LAPAROSCOPIC CHOLECYSTECTOMY WITH INTRAOPERATIVE CHOLANGIOGRAM;  Surgeon: Harl Bowie, MD;  Location: [facility];  Service: General;  Laterality: N/A;   Family History  Problem Relation Age of Onset  . Diabetes Father   . Heart disease Father   . Hypertension Sister   . Hypertension Brother   . Cancer Neg Hx   . Early death Neg Hx   . Hyperlipidemia Neg Hx   . Kidney disease Neg Hx   . Stroke Neg Hx   . Colon cancer Neg Hx    History  Substance Use Topics  . Smoking status: Never Smoker   . Smokeless tobacco: Never Used  . Alcohol Use: No     Comment: occasional, 2 x per year   OB History   Grav Para Term Preterm Abortions TAB SAB Ect Mult Living                 Review of Systems  Constitutional: Negative for fever, chills and fatigue.  Respiratory: Negative for cough, chest tightness and shortness of breath.   Cardiovascular: Negative for chest pain, palpitations and leg swelling.  Gastrointestinal: Positive for nausea, vomiting, abdominal pain and diarrhea. Negative for constipation, blood in stool, anal bleeding and rectal pain.  Genitourinary: Negative for dysuria, urgency, frequency, hematuria, vaginal bleeding, vaginal discharge, difficulty urinating and vaginal pain.  Neurological: Positive for dizziness (with standing).  All other systems reviewed and  are negative.     Allergies  Review of patient's allergies indicates no known allergies.  Home Medications   Prior to Admission medications   Medication Sig Start Date End Date Taking? Authorizing Provider  naproxen sodium (ANAPROX) 220 MG tablet Take 220 mg by mouth 2 (two) times daily as needed (for pain / headache).   Yes Historical Provider, MD  traMADol (ULTRAM) 50 MG tablet Take 50 mg by mouth every 6 (six) hours as needed for moderate pain.   Yes Historical Provider, MD  ondansetron (ZOFRAN) 4 MG tablet Take 1 tablet (4 mg total) by mouth every 6 (six) hours.  03/08/14   Ardie Mclennan A Forcucci, PA-C  penicillin v potassium (VEETID) 500 MG tablet Take 1 tablet (500 mg total) by mouth 4 (four) times daily. 02/24/14   Larene Pickett, PA-C   BP 153/75  Pulse 51  Temp(Src) 97.8 F (36.6 C) (Oral)  Resp 20  Ht 5\' 7"  (1.702 m)  Wt 210 lb (95.255 kg)  BMI 32.88 kg/m2  SpO2 100% Physical Exam  Nursing note and vitals reviewed. Constitutional: She is oriented to person, place, and time. She appears well-developed and well-nourished. No distress.  HENT:  Head: Normocephalic and atraumatic.  Mouth/Throat: Oropharynx is clear and moist. No oropharyngeal exudate.  Eyes: Conjunctivae and EOM are normal. Pupils are equal, round, and reactive to light. No scleral icterus.  Neck: Normal range of motion. Neck supple. No JVD present. No thyromegaly present.  Cardiovascular: Normal rate, regular rhythm, normal heart sounds and intact distal pulses.  Exam reveals no gallop and no friction rub.   No murmur heard. Pulmonary/Chest: Effort normal and breath sounds normal. No respiratory distress. She has no wheezes. She has no rales. She exhibits no tenderness.  Abdominal: Soft. Normal appearance and bowel sounds are normal. She exhibits no distension and no mass. There is generalized tenderness. There is no rigidity, no rebound, no guarding, no CVA tenderness, no tenderness at McBurney's point and negative Murphy's sign.  Musculoskeletal: Normal range of motion.  Lymphadenopathy:    She has no cervical adenopathy.  Neurological: She is alert and oriented to person, place, and time. She has normal strength. No cranial nerve deficit or sensory deficit. Coordination normal.  Skin: Skin is warm and dry. She is not diaphoretic.  Psychiatric: She has a normal mood and affect. Her behavior is normal. Judgment and thought content normal.    ED Course  Procedures (including critical care time) Labs Review Labs Reviewed  CBC WITH DIFFERENTIAL - Abnormal; Notable for the  following:    RBC 5.21 (*)    MCV 73.5 (*)    MCH 23.4 (*)    All other components within normal limits  COMPREHENSIVE METABOLIC PANEL - Abnormal; Notable for the following:    Glucose, Bld 107 (*)    GFR calc non Af Amer 56 (*)    GFR calc Af Amer 65 (*)    All other components within normal limits  URINALYSIS, ROUTINE W REFLEX MICROSCOPIC - Abnormal; Notable for the following:    Color, Urine AMBER (*)    APPearance HAZY (*)    Bilirubin Urine SMALL (*)    All other components within normal limits  LIPASE, BLOOD    Imaging Review No results found.   EKG Interpretation None      MDM   Final diagnoses:  Gastroenteritis  Non-intractable vomiting with nausea, vomiting of unspecified type   Patient is a 58 y.o. Female who presents to the  ED with generalized abdominal pain with nausea, vomiting, and diarrhea.  Physical exam reveals generalized tenderness to the belly with no evidence of guarding, rigidity, or tenderness to the RLQ.  CBC reveals no leukocytosis and no anemia.  CMP is unremarkable.  UA is negative.  Lipase is negative.  Patient was treated here with zofran and 1 L NS bolus.  Suspect viral gastroenteritis.  Patient has had good relief of symptoms here and is tolerating PO.  Doubt appendicitis.  Will discharge the patient home with zofran for symptomatic care. Patient to return for appendicitis symptoms, intractable vomiting, and GI bleeding.  Patient states understanding and agreement.  Patient was discussed with Dr. Stark Jock who agrees with the above plan and workup.        Cherylann Parr, PA-C 03/08/14 1313

## 2014-03-08 NOTE — ED Notes (Signed)
Pt given gingerale and crackers 

## 2014-09-08 IMAGING — US US ABDOMEN COMPLETE
1 series · 14 of 25 positions shown · non-contrast
Comparison: None.

CLINICAL DATA: Right upper quadrant abdominal pain.

EXAM:
ULTRASOUND ABDOMEN COMPLETE

[Series 1: us abdomen complete · 0.22mm/px · 14 of 83 slices shown]
[im 1/83]
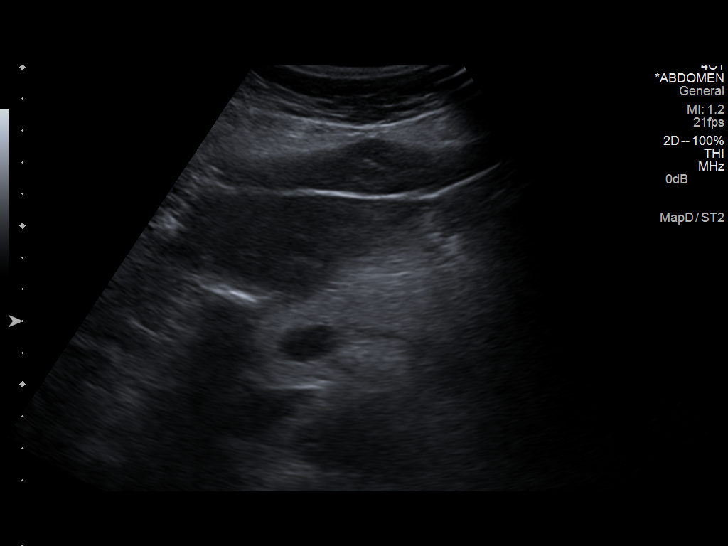
[im 7/83]
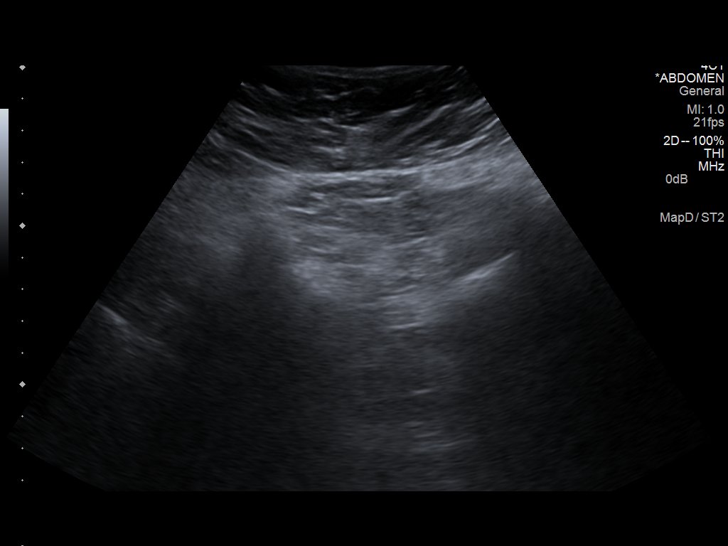
[im 14/83]
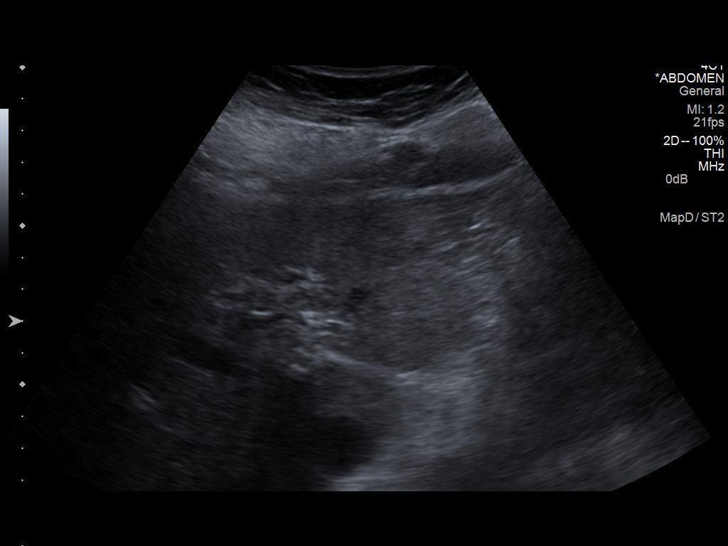
[im 21/83]
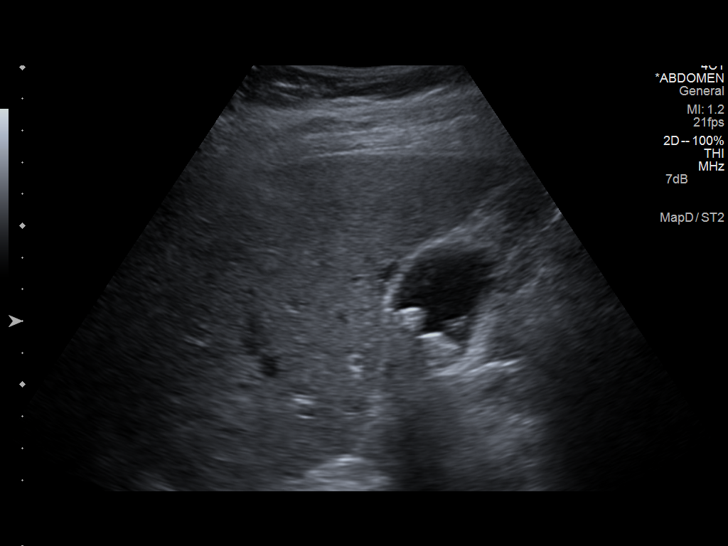
[im 28/83]
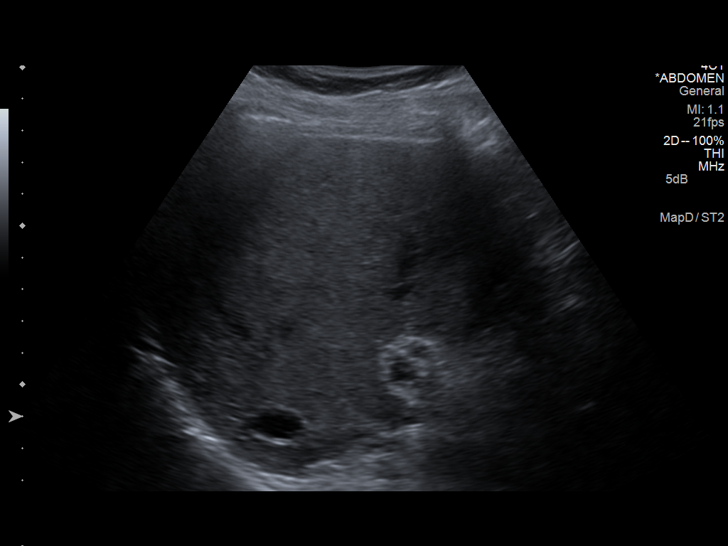
[im 31/83]
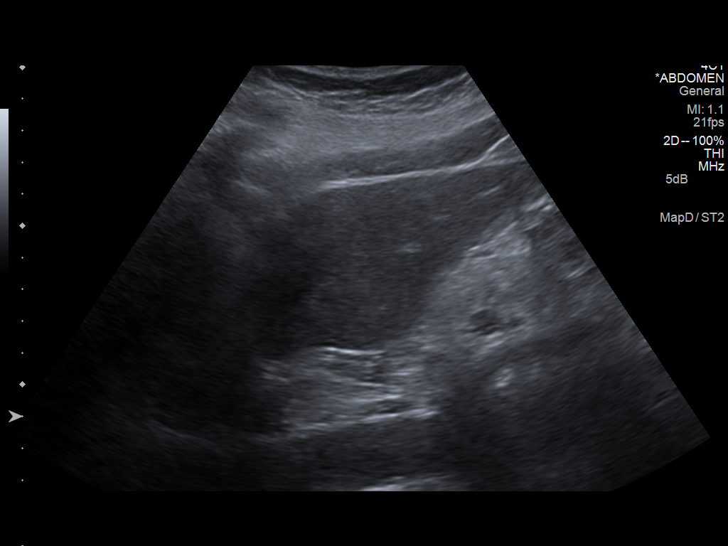
[im 38/83]
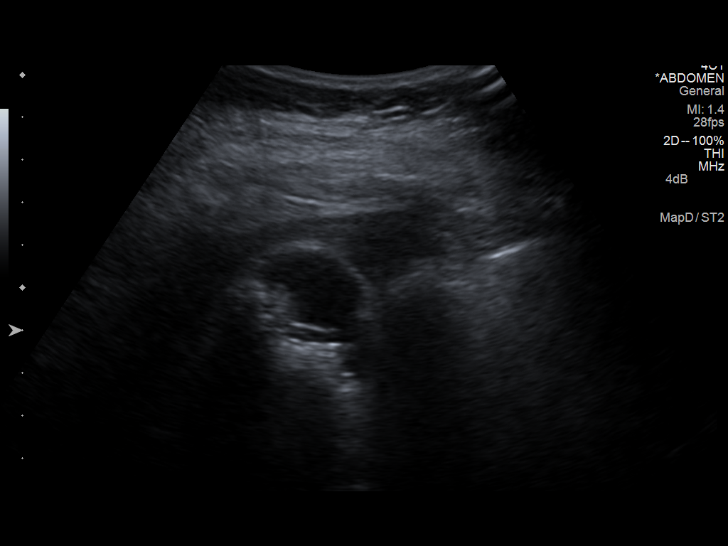
[im 45/83]
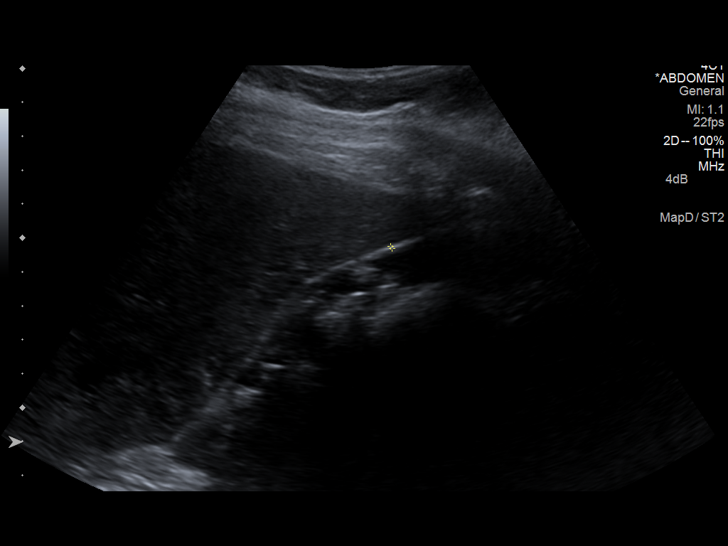
[im 52/83]
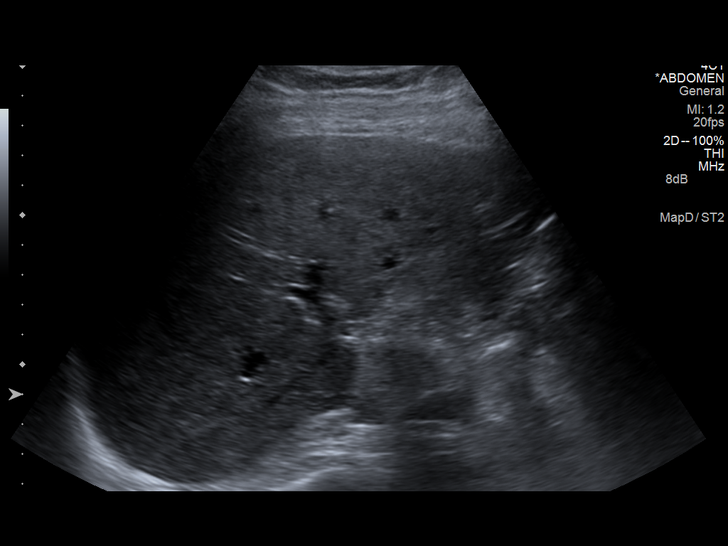
[im 55/83]
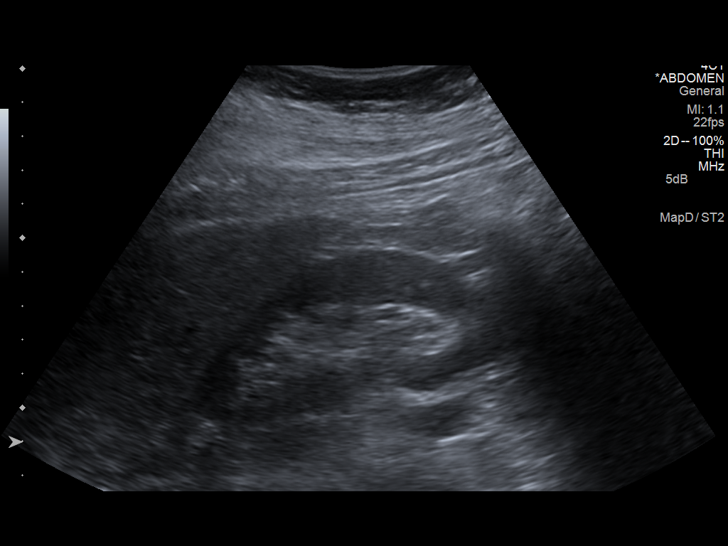
[im 62/83]
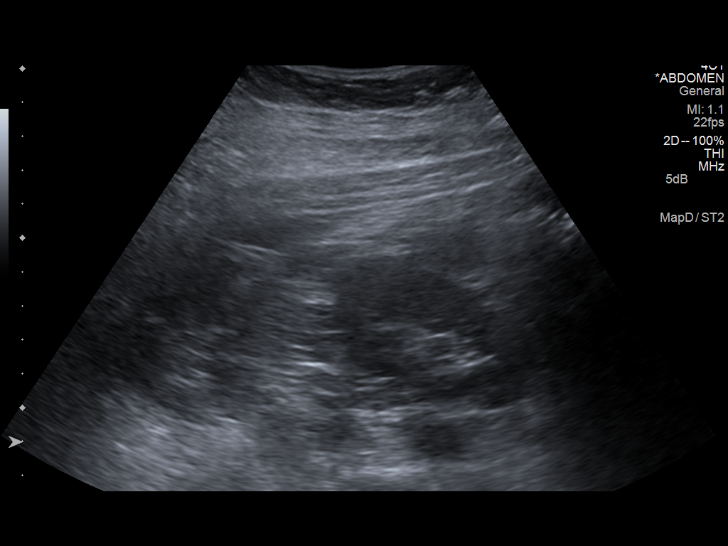
[im 69/83]
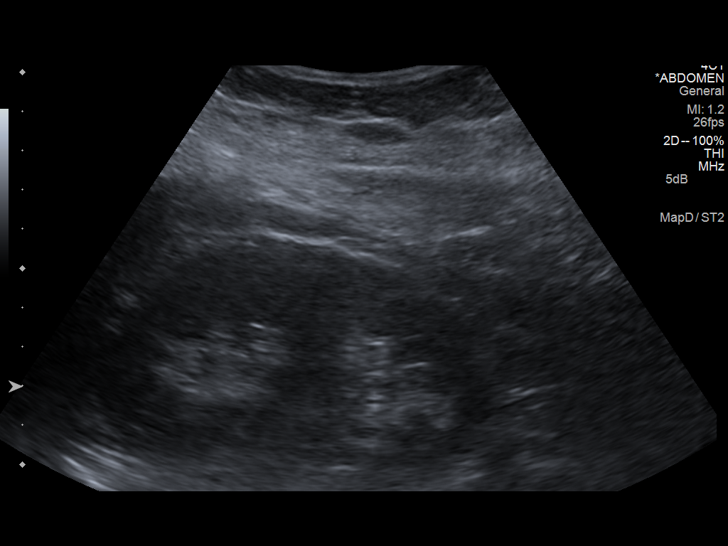
[im 76/83]
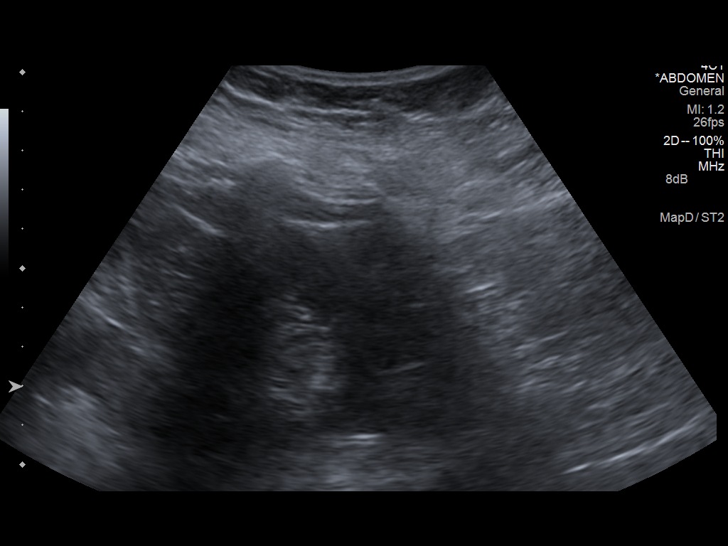
[im 83/83]
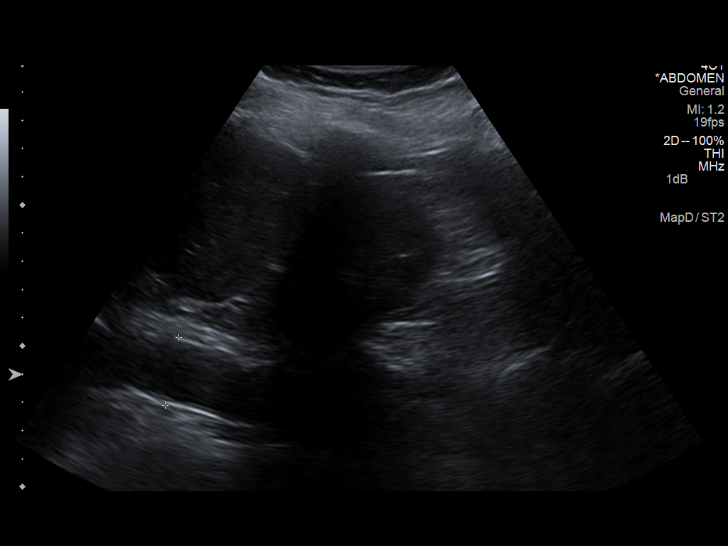

[14 of 25 positions shown; findings below may reference images not displayed]

FINDINGS: Gallbladder

Numerous echogenic gallstones are noted in the gallbladder with
associated acoustic shadowing. No gallbladder wall thickening or
pericholecystic fluid but there is a positive sonographic Murphy
sign.

Common bile duct

Diameter: Dilated at 8.8 mm. No definite common bile duct stones.

Liver

Normal echogenicity without focal lesions. Mild central intrahepatic
biliary dilatation.

IVC

Normal caliber.

Pancreas

Sonographically normal.

Spleen

Normal size and echogenicity without focal lesions.

Right Kidney

Length: 10.3 cm. Echogenicity within normal limits. No mass or
hydronephrosis visualized.

Left Kidney

Length: 10.6 cm. Echogenicity within normal limits. No mass or
hydronephrosis visualized.

Abdominal aorta

Normal caliber.
IMPRESSION: Chololithiasis and positive sonographic Murphy sign and mild biliary
dilatation. Findings suggest cholecystitis.

The remainder of the examination is unremarkable.

## 2014-09-18 ENCOUNTER — Encounter (HOSPITAL_COMMUNITY): Payer: Self-pay | Admitting: *Deleted

## 2014-09-18 ENCOUNTER — Emergency Department (HOSPITAL_COMMUNITY)
Admission: EM | Admit: 2014-09-18 | Discharge: 2014-09-18 | Disposition: A | Discharge status: Home / Self Care | Payer: 59 | Attending: Emergency Medicine | Admitting: Emergency Medicine

## 2014-09-18 DIAGNOSIS — K0889 Other specified disorders of teeth and supporting structures: Secondary | ICD-10-CM

## 2014-09-18 DIAGNOSIS — Z8639 Personal history of other endocrine, nutritional and metabolic disease: Secondary | ICD-10-CM | POA: Insufficient documentation

## 2014-09-18 DIAGNOSIS — K088 Other specified disorders of teeth and supporting structures: Secondary | ICD-10-CM | POA: Insufficient documentation

## 2014-09-18 DIAGNOSIS — Z8719 Personal history of other diseases of the digestive system: Secondary | ICD-10-CM | POA: Insufficient documentation

## 2014-09-18 DIAGNOSIS — Z87442 Personal history of urinary calculi: Secondary | ICD-10-CM | POA: Insufficient documentation

## 2014-09-18 DIAGNOSIS — I10 Essential (primary) hypertension: Secondary | ICD-10-CM | POA: Insufficient documentation

## 2014-09-18 DIAGNOSIS — Z87440 Personal history of urinary (tract) infections: Secondary | ICD-10-CM | POA: Insufficient documentation

## 2014-09-18 DIAGNOSIS — Z862 Personal history of diseases of the blood and blood-forming organs and certain disorders involving the immune mechanism: Secondary | ICD-10-CM | POA: Insufficient documentation

## 2014-09-18 MED ORDER — TRAMADOL HCL 50 MG PO TABS: 50.0000 mg | ORAL_TABLET | Freq: Four times a day (QID) | ORAL | Status: DC | PRN

## 2014-09-18 MED ORDER — NAPROXEN 500 MG PO TABS: 500.0000 mg | ORAL_TABLET | Freq: Two times a day (BID) | ORAL | Status: DC

## 2014-09-18 MED ORDER — BUPIVACAINE-EPINEPHRINE (PF) 0.5% -1:200000 IJ SOLN
1.8000 mL | Freq: Once | INTRAMUSCULAR | Status: AC
  Administered 2014-09-18: 1.8 mL
  Filled 2014-09-18: qty 1.8

## 2014-09-18 MED ORDER — PENICILLIN V POTASSIUM 500 MG PO TABS: 500.0000 mg | ORAL_TABLET | Freq: Four times a day (QID) | ORAL | Status: AC

## 2014-09-18 NOTE — ED Provider Notes (Signed)
CSN: [id number]     Arrival date & time 09/18/14  1541 History  This chart was scribed for Britt Bottom, NP, working with Francine Graven, DO by Steva Colder, ED Scribe. The patient was seen in room TR07C/TR07C at 3:51 PM.    Chief Complaint  Patient presents with  . Dental Pain    The history is provided by the patient. No language interpreter was used.   HPI Comments: Marisa Robinson is a 59 y.o. female with HTN who presents to the Emergency Department complaining of dental pain onset 3 night ago. Pt thought that the pain was due to her being stressed. Pt thinks that her tongue is swollen more on one side then the other. Pt notes that the tenderness on the tooth was the pain that began first. Pt rates her pain 7-8/10. Pt has had several teeth removed from the back of her mouth. Pt does not have a dentist. She denies trouble swallowing, fever, chills, and any other symptoms.   Past Medical History  Diagnosis Date  . Urinary tract infection   . Anemia   . Thyroid disease   . Hyperlipidemia   . History of kidney stones   . Gallstones   . Hypertension   . Hypothyroidism    Past Surgical History  Procedure Laterality Date  . Etopic surgery  1985, 1987  . Cholecystectomy  04/07/2013  . Cholecystectomy N/A 04/07/2013    Procedure: LAPAROSCOPIC CHOLECYSTECTOMY WITH INTRAOPERATIVE CHOLANGIOGRAM;  Surgeon: Harl Bowie, MD;  Location: [place];  Service: General;  Laterality: N/A;   Family History  Problem Relation Age of Onset  . Diabetes Father   . Heart disease Father   . Hypertension Sister   . Hypertension Brother   . Cancer Neg Hx   . Early death Neg Hx   . Hyperlipidemia Neg Hx   . Kidney disease Neg Hx   . Stroke Neg Hx   . Colon cancer Neg Hx    History  Substance Use Topics  . Smoking status: Never Smoker   . Smokeless tobacco: Never Used  . Alcohol Use: No     Comment: occasional, 2 x per year   OB History    No data available     Review of Systems   Constitutional: Negative for fever and chills.  HENT: Positive for dental problem. Negative for trouble swallowing.   Neurological: Positive for headaches.    Allergies  Review of patient's allergies indicates no known allergies.  Home Medications   Prior to Admission medications   Medication Sig Start Date End Date Taking? Authorizing Provider  naproxen sodium (ANAPROX) 220 MG tablet Take 220 mg by mouth 2 (two) times daily as needed (for pain / headache).    Historical Provider, MD  ondansetron (ZOFRAN) 4 MG tablet Take 1 tablet (4 mg total) by mouth every 6 (six) hours. 03/08/14   Courtney Forcucci, PA-C  penicillin v potassium (VEETID) 500 MG tablet Take 1 tablet (500 mg total) by mouth 4 (four) times daily. 02/24/14   Larene Pickett, PA-C  traMADol (ULTRAM) 50 MG tablet Take 50 mg by mouth every 6 (six) hours as needed for moderate pain.    Historical Provider, MD   BP 174/86 mmHg  Pulse 54  Temp(Src) 98.6 F (37 C) (Oral)  Resp 16  Ht 5\' 6"  (1.676 m)  Wt 217 lb 8 oz (98.657 kg)  BMI 35.12 kg/m2  SpO2 100%   Physical Exam  Constitutional: She is oriented to  person, place, and time. She appears well-developed and well-nourished. No distress.  HENT:  Head: Normocephalic and atraumatic.  Mouth/Throat: No trismus in the jaw. No dental abscesses.  2nd pre-molar is TTP.   Eyes: EOM are normal.  Neck: Neck supple. No tracheal deviation present.  Cardiovascular: Normal rate.   Pulmonary/Chest: Effort normal. No respiratory distress.  Musculoskeletal: Normal range of motion.  Neurological: She is alert and oriented to person, place, and time.  Skin: Skin is warm and dry.  Psychiatric: She has a normal mood and affect. Her behavior is normal.  Nursing note and vitals reviewed.   ED Course  Procedures (including critical care time)  NERVE BLOCK Performed by: Britt Bottom Consent: Verbal consent obtained. Required items: required blood products, implants, devices,  and special equipment available Time out: Immediately prior to procedure a "time out" was called to verify the correct patient, procedure, equipment, support staff and site/side marked as required.  Indication: dental pain Nerve block body site: right inferior, alveolar nerve  Preparation: Patient was prepped and draped in the usual sterile fashion. Needle gauge: 74 G Location technique: anatomical landmarks  Local anesthetic: bupivicaine with epi  Anesthetic total: 1.8 ml  Outcome: pain improved Patient tolerance: Patient tolerated the procedure well with no immediate complications.   DIAGNOSTIC STUDIES: Oxygen Saturation is 100% on RA, normal by my interpretation.    COORDINATION OF CARE: 3:56 PM-Discussed treatment plan which includes abx, referral and f/u with dentist with pt at bedside and pt agreed to plan.   Labs Review Labs Reviewed - No data to display  Imaging Review No results found.   EKG Interpretation None      MDM   Final diagnoses:  Pain, dental   59 yo with toothache but no indication of gross abscess, Ludwig's angina or spread of infection.  Pain resolved after dental block. Prescription for penicillin and pain medicine and NSAIDs provided. Pt is well-appearing, in no acute distress and vital signs reviewed and not concerning. She appears safe to be discharged.  Discharge include resources to follow-up with a dentist.  Return precautions provided. Pt aware of plan and in agreement.     I personally performed the services described in this documentation, which was scribed in my presence. The recorded information has been reviewed and is accurate.  Filed Vitals:   09/18/14 1556  BP: 174/86  Pulse: 54  Temp: 98.6 F (37 C)  TempSrc: Oral  Resp: 16  Height: 5\' 6"  (1.676 m)  Weight: 217 lb 8 oz (98.657 kg)  SpO2: 100%   Meds given in ED:  Medications  bupivacaine-epinephrine (MARCAINE W/ EPI) 0.5% -1:200000 injection 1.8 mL (1.8 mLs  Infiltration Given 09/18/14 1604)    Discharge Medication List as of 09/18/2014  4:04 PM    START taking these medications   Details  naproxen (NAPROSYN) 500 MG tablet Take 1 tablet (500 mg total) by mouth 2 (two) times daily., Starting 09/18/2014, Until Discontinued, Print          Britt Bottom, NP 09/18/14 5027  Fredia Sorrow, MD 09/19/14 [phone]

## 2014-09-18 NOTE — ED Notes (Signed)
Declined W/C at D/C and was escorted to lobby by RN. 

## 2014-09-18 NOTE — Discharge Instructions (Signed)
Please follow the directions provided. Is very important free to follow-up with the dentist for further management of this tooth pain. Please take the naproxen twice a day to help with pain and inflammation. You may take the tramadol for pain not relieved by the naproxen. Be sure to take the antibiotic until it is all gone. Don't hesitate to return for any new, worsening, or concerning symptoms.   SEEK IMMEDIATE MEDICAL CARE IF:  You have a fever.  You develop redness and swelling of your face, jaw, or neck.  You are unable to open your mouth.  You have severe pain uncontrolled by pain medicine.

## 2014-09-18 NOTE — ED Notes (Signed)
Pt reports Rt upper tooth with tenderness to mouth.

## 2015-09-18 ENCOUNTER — Encounter (HOSPITAL_COMMUNITY): Payer: Self-pay | Admitting: Emergency Medicine

## 2015-09-18 ENCOUNTER — Emergency Department (HOSPITAL_COMMUNITY)
Admission: EM | Admit: 2015-09-18 | Discharge: 2015-09-18 | Disposition: A | Discharge status: Home / Self Care | Payer: Non-veteran care | Attending: Emergency Medicine | Admitting: Emergency Medicine

## 2015-09-18 DIAGNOSIS — Z8639 Personal history of other endocrine, nutritional and metabolic disease: Secondary | ICD-10-CM | POA: Diagnosis not present

## 2015-09-18 DIAGNOSIS — Z3202 Encounter for pregnancy test, result negative: Secondary | ICD-10-CM | POA: Insufficient documentation

## 2015-09-18 DIAGNOSIS — Z87440 Personal history of urinary (tract) infections: Secondary | ICD-10-CM | POA: Diagnosis not present

## 2015-09-18 DIAGNOSIS — N938 Other specified abnormal uterine and vaginal bleeding: Secondary | ICD-10-CM | POA: Insufficient documentation

## 2015-09-18 DIAGNOSIS — D509 Iron deficiency anemia, unspecified: Secondary | ICD-10-CM | POA: Diagnosis not present

## 2015-09-18 DIAGNOSIS — I10 Essential (primary) hypertension: Secondary | ICD-10-CM | POA: Insufficient documentation

## 2015-09-18 DIAGNOSIS — Z8719 Personal history of other diseases of the digestive system: Secondary | ICD-10-CM | POA: Diagnosis not present

## 2015-09-18 DIAGNOSIS — Z79899 Other long term (current) drug therapy: Secondary | ICD-10-CM | POA: Diagnosis not present

## 2015-09-18 DIAGNOSIS — R001 Bradycardia, unspecified: Secondary | ICD-10-CM | POA: Insufficient documentation

## 2015-09-18 DIAGNOSIS — N939 Abnormal uterine and vaginal bleeding, unspecified: Secondary | ICD-10-CM | POA: Diagnosis present

## 2015-09-18 LAB — WET PREP, GENITAL
Clue Cells Wet Prep HPF POC: NONE SEEN
Sperm: NONE SEEN
TRICH WET PREP: NONE SEEN
Yeast Wet Prep HPF POC: NONE SEEN

## 2015-09-18 LAB — URINALYSIS, ROUTINE W REFLEX MICROSCOPIC
Bilirubin Urine: NEGATIVE
GLUCOSE, UA: NEGATIVE mg/dL
KETONES UR: NEGATIVE mg/dL
LEUKOCYTES UA: NEGATIVE
Nitrite: NEGATIVE
PROTEIN: NEGATIVE mg/dL
Specific Gravity, Urine: 1.024 (ref 1.005–1.030)
pH: 5 (ref 5.0–8.0)

## 2015-09-18 LAB — CBC WITH DIFFERENTIAL/PLATELET
BASOS PCT: 0 %
Basophils Absolute: 0 10*3/uL (ref 0.0–0.1)
EOS PCT: 1 %
Eosinophils Absolute: 0.1 10*3/uL (ref 0.0–0.7)
HCT: 37 % (ref 36.0–46.0)
Hemoglobin: 11.2 g/dL — ABNORMAL LOW (ref 12.0–15.0)
Lymphocytes Relative: 29 %
Lymphs Abs: 2 10*3/uL (ref 0.7–4.0)
MCH: 23 pg — ABNORMAL LOW (ref 26.0–34.0)
MCHC: 30.3 g/dL (ref 30.0–36.0)
MCV: 76.1 fL — ABNORMAL LOW (ref 78.0–100.0)
MONO ABS: 0.4 10*3/uL (ref 0.1–1.0)
Monocytes Relative: 6 %
NEUTROS ABS: 4.5 10*3/uL (ref 1.7–7.7)
Neutrophils Relative %: 64 %
Platelets: 256 10*3/uL (ref 150–400)
RBC: 4.86 MIL/uL (ref 3.87–5.11)
RDW: 15 % (ref 11.5–15.5)
WBC: 7 10*3/uL (ref 4.0–10.5)

## 2015-09-18 LAB — I-STAT BETA HCG BLOOD, ED (MC, WL, AP ONLY)

## 2015-09-18 LAB — URINE MICROSCOPIC-ADD ON

## 2015-09-18 MED ORDER — FERROUS SULFATE 325 (65 FE) MG PO TABS: 325.0000 mg | ORAL_TABLET | Freq: Every day | ORAL | Status: DC

## 2015-09-18 NOTE — ED Notes (Signed)
Pt c/o vaginal bleeding started yesterday-- bright red, has had abd cramping also--

## 2015-09-18 NOTE — ED Notes (Signed)
Pt getting undressed and chucks placed on bed. Pt was informed to remove all clothing to prepare for the pelvic exam.

## 2015-09-18 NOTE — ED Provider Notes (Signed)
CSN: [id number]     Arrival date & time 09/18/15  1148 History   First MD Initiated Contact with Patient 09/18/15 1412     Chief Complaint  Patient presents with  . Vaginal Bleeding     (Consider location/radiation/quality/duration/timing/severity/associated sxs/prior Treatment) HPI Patient had vaginal bleeding which started yesterday. She reports she had bleeding less than 1 pad. She had mild crampy lower abdominal pain yesterday which has since resolved. She is presently asymptomatic. No treatment prior to coming here. Nothing makes symptoms better or worse. Denies sexual activity.  Past Medical History  Diagnosis Date  . Urinary tract infection   . Anemia   . Thyroid disease   . Hyperlipidemia   . History of kidney stones   . Gallstones   . Hypertension   . Hypothyroidism    Past Surgical History  Procedure Laterality Date  . Etopic surgery  1985, 1987  . Cholecystectomy  04/07/2013  . Cholecystectomy N/A 04/07/2013    Procedure: LAPAROSCOPIC CHOLECYSTECTOMY WITH INTRAOPERATIVE CHOLANGIOGRAM;  Surgeon: Harl Bowie, MD;  Location: [place];  Service: General;  Laterality: N/A;   Family History  Problem Relation Age of Onset  . Diabetes Father   . Heart disease Father   . Hypertension Sister   . Hypertension Brother   . Cancer Neg Hx   . Early death Neg Hx   . Hyperlipidemia Neg Hx   . Kidney disease Neg Hx   . Stroke Neg Hx   . Colon cancer Neg Hx    Social History  Substance Use Topics  . Smoking status: Never Smoker   . Smokeless tobacco: Never Used  . Alcohol Use: No     Comment: occasional, 2 x per year   OB History    No data available     Review of Systems  Constitutional: Negative.   HENT: Negative.   Respiratory: Negative.   Cardiovascular: Negative.   Gastrointestinal: Positive for abdominal pain.  Genitourinary: Positive for vaginal bleeding.  Musculoskeletal: Negative.   Skin: Negative.   Neurological: Negative.   Psychiatric/Behavioral:  Negative.   All other systems reviewed and are negative.     Allergies  Review of patient's allergies indicates no known allergies.  Home Medications   Prior to Admission medications   Medication Sig Start Date End Date Taking? Authorizing Provider  naproxen (NAPROSYN) 500 MG tablet Take 1 tablet (500 mg total) by mouth 2 (two) times daily. 09/18/14   Britt Bottom, NP  naproxen sodium (ANAPROX) 220 MG tablet Take 220 mg by mouth 2 (two) times daily as needed (for pain / headache).    Historical Provider, MD  ondansetron (ZOFRAN) 4 MG tablet Take 1 tablet (4 mg total) by mouth every 6 (six) hours. 03/08/14   Courtney Forcucci, PA-C  traMADol (ULTRAM) 50 MG tablet Take 1 tablet (50 mg total) by mouth every 6 (six) hours as needed. 09/18/14   Britt Bottom, NP   BP 149/74 mmHg  Pulse 55  Temp(Src) 98.7 F (37.1 C) (Oral)  Resp 16  Ht 5\' 6"  (1.676 m)  Wt 222 lb 3.2 oz (100.789 kg)  BMI 35.88 kg/m2  SpO2 100% Physical Exam  Constitutional: She appears well-developed and well-nourished.  HENT:  Head: Normocephalic and atraumatic.  Eyes: Conjunctivae are normal. Pupils are equal, round, and reactive to light.  Neck: Neck supple. No tracheal deviation present. No thyromegaly present.  Cardiovascular: Regular rhythm.   No murmur heard. Bradycardic  Pulmonary/Chest: Effort normal and breath sounds normal.  Abdominal: Soft. Bowel sounds are normal. She exhibits no distension. There is no tenderness.  Obese  Genitourinary:  No external lesion. Cervical os closed. Minimal blood in vaginal vault. No cervical motion tenderness no adnexal masses or tenderness.  Musculoskeletal: Normal range of motion. She exhibits no edema or tenderness.  Neurological: She is alert. Coordination normal.  Skin: Skin is warm and dry. No rash noted.  Psychiatric: She has a normal mood and affect.  Nursing note and vitals reviewed.   ED Course  Procedures (including critical care time) Labs  Review Labs Reviewed  URINALYSIS, ROUTINE W REFLEX MICROSCOPIC (NOT AT [facility]) - Abnormal; Notable for the following:    APPearance CLOUDY (*)    Hgb urine dipstick LARGE (*)    All other components within normal limits  URINE MICROSCOPIC-ADD ON - Abnormal; Notable for the following:    Squamous Epithelial / LPF 0-5 (*)    Bacteria, UA RARE (*)    Crystals CA OXALATE CRYSTALS (*)    All other components within normal limits  I-STAT BETA HCG BLOOD, ED ([facility], [facility], [facility] ONLY)    Imaging Review No results found. I have personally reviewed and evaluated these images and lab results as part of my medical decision-making.   EKG Interpretation None      Results for orders placed or performed during the hospital encounter of 09/18/15  Wet prep, genital  Result Value Ref Range   Yeast Wet Prep HPF POC NONE SEEN NONE SEEN   Trich, Wet Prep NONE SEEN NONE SEEN   Clue Cells Wet Prep HPF POC NONE SEEN NONE SEEN   WBC, Wet Prep HPF POC MANY (A) NONE SEEN   Sperm NONE SEEN   Urinalysis, Routine w reflex microscopic (not at [facility])  Result Value Ref Range   Color, Urine YELLOW YELLOW   APPearance CLOUDY (A) CLEAR   Specific Gravity, Urine 1.024 1.005 - 1.030   pH 5.0 5.0 - 8.0   Glucose, UA NEGATIVE NEGATIVE mg/dL   Hgb urine dipstick LARGE (A) NEGATIVE   Bilirubin Urine NEGATIVE NEGATIVE   Ketones, ur NEGATIVE NEGATIVE mg/dL   Protein, ur NEGATIVE NEGATIVE mg/dL   Nitrite NEGATIVE NEGATIVE   Leukocytes, UA NEGATIVE NEGATIVE  Urine microscopic-add on  Result Value Ref Range   Squamous Epithelial / LPF 0-5 (A) NONE SEEN   WBC, UA 0-5 0 - 5 WBC/hpf   RBC / HPF TOO NUMEROUS TO COUNT 0 - 5 RBC/hpf   Bacteria, UA RARE (A) NONE SEEN   Crystals CA OXALATE CRYSTALS (A) NEGATIVE  CBC with Differential  Result Value Ref Range   WBC 7.0 4.0 - 10.5 K/uL   RBC 4.86 3.87 - 5.11 MIL/uL   Hemoglobin 11.2 (L) 12.0 - 15.0 g/dL   HCT 37.0 36.0 - 46.0 %   MCV 76.1 (L) 78.0 - 100.0 fL   MCH 23.0 (L) 26.0 -  34.0 pg   MCHC 30.3 30.0 - 36.0 g/dL   RDW 15.0 11.5 - 15.5 %   Platelets 256 150 - 400 K/uL   Neutrophils Relative % 64 %   Neutro Abs 4.5 1.7 - 7.7 K/uL   Lymphocytes Relative 29 %   Lymphs Abs 2.0 0.7 - 4.0 K/uL   Monocytes Relative 6 %   Monocytes Absolute 0.4 0.1 - 1.0 K/uL   Eosinophils Relative 1 %   Eosinophils Absolute 0.1 0.0 - 0.7 K/uL   Basophils Relative 0 %   Basophils Absolute 0.0 0.0 - 0.1 K/uL  I-Stat beta hCG blood, ED ([facility], [facility], [facility] only)  Result Value Ref Range   I-stat hCG, quantitative <5.0 <5 mIU/mL   Comment 3           No results found.   MDM  I discussed with patient at length that I'm concerned about possibility of cancer with postmenopausal vaginal bleeding. She agrees to follow-up with her gynecologist. Also suggest recheck blood pressure within 3 weeks. Prescription ferrous sulfate. Diagnosis #1 dysfunctional uterine bleeding #2 microcytic anemia #3 elevated blood pressure Final diagnoses:  None        Orlie Dakin, MD 09/18/15 1624

## 2015-09-18 NOTE — Discharge Instructions (Signed)
Dysfunctional Uterine Bleeding Contact your gynecologist tomorrow to schedule the next available appointment. Vaginal bleeding after menopause raise concern for cancer of the uterus. You're mildly anemic today your hemoglobin is 11.2. Take the iron as prescribed. Your blood pressure should be rechecked within the next 3 weeks. Today's was mildly elevated at 152/79. Dysfunctional uterine bleeding is abnormal bleeding from the uterus. Dysfunctional uterine bleeding includes:  A period that comes earlier or later than usual.  A period that is lighter, heavier, or has blood clots.  Bleeding between periods.  Skipping one or more periods.  Bleeding after sexual intercourse.  Bleeding after menopause. HOME CARE INSTRUCTIONS  Pay attention to any changes in your symptoms. Follow these instructions to help with your condition: Eating  Eat well-balanced meals. Include foods that are high in iron, such as liver, meat, shellfish, green leafy vegetables, and eggs.  If you become constipated:  Drink plenty of water.  Eat fruits and vegetables that are high in water and fiber, such as spinach, carrots, raspberries, apples, and mango. Medicines  Take over-the-counter and prescription medicines only as told by your health care provider.  Do not change medicines without talking with your health care provider.  Aspirin or medicines that contain aspirin may make the bleeding worse. Do not take those medicines:  During the week before your period.  During your period.  If you were prescribed iron pills, take them as told by your health care provider. Iron pills help to replace iron that your body loses because of this condition. Activity  If you need to change your sanitary pad or tampon more than one time every 2 hours:  Lie in bed with your feet raised (elevated).  Place a cold pack on your lower abdomen.  Rest as much as possible until the bleeding stops or slows down.  Do not try to  lose weight until the bleeding has stopped and your blood iron level is back to normal. Other Instructions  For two months, write down:  When your period starts.  When your period ends.  When any abnormal bleeding occurs.  What problems you notice.  Keep all follow up visits as told by your health care provider. This is important. SEEK MEDICAL CARE IF:  You get light-headed or weak.  You have nausea and vomiting.  You cannot eat or drink without vomiting.  You feel dizzy or have diarrhea while you are taking medicines.  You are taking birth control pills or hormones, and you want to change them or stop taking them. SEEK IMMEDIATE MEDICAL CARE IF:  You develop a fever or chills.  You need to change your sanitary pad or tampon more than one time per hour.  Your bleeding becomes heavier, or your flow contains clots more often.  You develop pain in your abdomen.  You lose consciousness.  You develop a rash.   This information is not intended to replace advice given to you by your health care provider. Make sure you discuss any questions you have with your health care provider.   Document Released: 05/18/2000 Document Revised: 02/09/2015 Document Reviewed: 08/16/2014 Elsevier Interactive Patient Education [employer].

## 2015-09-18 NOTE — ED Notes (Signed)
Pt is in stable condition upon d/c and ambulates from ED. 

## 2015-09-19 LAB — HIV ANTIBODY (ROUTINE TESTING W REFLEX): HIV SCREEN 4TH GENERATION: NONREACTIVE

## 2015-09-19 LAB — RPR: RPR: NONREACTIVE

## 2015-09-19 LAB — GC/CHLAMYDIA PROBE AMP (~~LOC~~) NOT AT ARMC
Chlamydia: NEGATIVE
NEISSERIA GONORRHEA: NEGATIVE

## 2017-02-19 ENCOUNTER — Encounter (HOSPITAL_COMMUNITY): Payer: Self-pay | Admitting: *Deleted

## 2017-02-19 ENCOUNTER — Emergency Department (HOSPITAL_COMMUNITY): Payer: Non-veteran care

## 2017-02-19 ENCOUNTER — Emergency Department (HOSPITAL_COMMUNITY)
Admission: EM | Admit: 2017-02-19 | Discharge: 2017-02-19 | Disposition: A | Discharge status: Home / Self Care | Payer: Non-veteran care | Attending: Emergency Medicine | Admitting: Emergency Medicine

## 2017-02-19 DIAGNOSIS — R109 Unspecified abdominal pain: Secondary | ICD-10-CM | POA: Diagnosis not present

## 2017-02-19 DIAGNOSIS — R079 Chest pain, unspecified: Secondary | ICD-10-CM | POA: Diagnosis not present

## 2017-02-19 DIAGNOSIS — Z5321 Procedure and treatment not carried out due to patient leaving prior to being seen by health care provider: Secondary | ICD-10-CM | POA: Insufficient documentation

## 2017-02-19 DIAGNOSIS — R0602 Shortness of breath: Secondary | ICD-10-CM | POA: Diagnosis not present

## 2017-02-19 LAB — CBC
HEMATOCRIT: 36.5 % (ref 36.0–46.0)
HEMOGLOBIN: 11.3 g/dL — AB (ref 12.0–15.0)
MCH: 23.4 pg — ABNORMAL LOW (ref 26.0–34.0)
MCHC: 31 g/dL (ref 30.0–36.0)
MCV: 75.7 fL — ABNORMAL LOW (ref 78.0–100.0)
Platelets: 245 10*3/uL (ref 150–400)
RBC: 4.82 MIL/uL (ref 3.87–5.11)
RDW: 14.8 % (ref 11.5–15.5)
WBC: 6.7 10*3/uL (ref 4.0–10.5)

## 2017-02-19 LAB — BASIC METABOLIC PANEL
ANION GAP: 9 (ref 5–15)
BUN: 14 mg/dL (ref 6–20)
CHLORIDE: 104 mmol/L (ref 101–111)
CO2: 24 mmol/L (ref 22–32)
Calcium: 8.7 mg/dL — ABNORMAL LOW (ref 8.9–10.3)
Creatinine, Ser: 1.06 mg/dL — ABNORMAL HIGH (ref 0.44–1.00)
GFR calc Af Amer: >60 mL/min (ref >60)
GFR, EST NON AFRICAN AMERICAN: 55 mL/min — AB (ref >60)
Glucose, Bld: 100 mg/dL — ABNORMAL HIGH (ref 65–99)
POTASSIUM: 3.9 mmol/L (ref 3.5–5.1)
SODIUM: 137 mmol/L (ref 135–145)

## 2017-02-19 LAB — I-STAT TROPONIN, ED: Troponin i, poc: 0.01 ng/mL (ref 0.00–0.08)

## 2017-02-19 NOTE — ED Notes (Signed)
Called for vitals x5, no answer.

## 2017-02-19 NOTE — ED Triage Notes (Signed)
Pt states that she started having abdominal pain on Saturday and then started having chest pain that radiates to her back.  Pt reports some sob.

## 2017-02-19 NOTE — ED Notes (Signed)
Pt stated that she was going to the car but she was going to come back. She didn't say that she was leaving or not.

## 2017-09-04 ENCOUNTER — Encounter (HOSPITAL_COMMUNITY): Payer: Self-pay | Admitting: Emergency Medicine

## 2017-09-04 ENCOUNTER — Emergency Department (HOSPITAL_COMMUNITY)
Admission: EM | Admit: 2017-09-04 | Discharge: 2017-09-05 | Disposition: A | Discharge status: Home / Self Care | Payer: No Typology Code available for payment source | Attending: Emergency Medicine | Admitting: Emergency Medicine

## 2017-09-04 DIAGNOSIS — Y998 Other external cause status: Secondary | ICD-10-CM | POA: Diagnosis not present

## 2017-09-04 DIAGNOSIS — Y92410 Unspecified street and highway as the place of occurrence of the external cause: Secondary | ICD-10-CM | POA: Insufficient documentation

## 2017-09-04 DIAGNOSIS — Z9049 Acquired absence of other specified parts of digestive tract: Secondary | ICD-10-CM | POA: Diagnosis not present

## 2017-09-04 DIAGNOSIS — S161XXA Strain of muscle, fascia and tendon at neck level, initial encounter: Secondary | ICD-10-CM | POA: Diagnosis not present

## 2017-09-04 DIAGNOSIS — Y9389 Activity, other specified: Secondary | ICD-10-CM | POA: Insufficient documentation

## 2017-09-04 DIAGNOSIS — E039 Hypothyroidism, unspecified: Secondary | ICD-10-CM | POA: Diagnosis not present

## 2017-09-04 DIAGNOSIS — I10 Essential (primary) hypertension: Secondary | ICD-10-CM | POA: Insufficient documentation

## 2017-09-04 DIAGNOSIS — S199XXA Unspecified injury of neck, initial encounter: Secondary | ICD-10-CM | POA: Diagnosis present

## 2017-09-04 DIAGNOSIS — R1012 Left upper quadrant pain: Secondary | ICD-10-CM | POA: Diagnosis not present

## 2017-09-04 DIAGNOSIS — R079 Chest pain, unspecified: Secondary | ICD-10-CM | POA: Diagnosis not present

## 2017-09-04 DIAGNOSIS — Z79899 Other long term (current) drug therapy: Secondary | ICD-10-CM | POA: Insufficient documentation

## 2017-09-04 DIAGNOSIS — E119 Type 2 diabetes mellitus without complications: Secondary | ICD-10-CM | POA: Diagnosis not present
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
## 2017-09-05 ENCOUNTER — Emergency Department (HOSPITAL_COMMUNITY): Payer: No Typology Code available for payment source

## 2017-09-05 LAB — COMPREHENSIVE METABOLIC PANEL
ALBUMIN: 3.8 g/dL (ref 3.5–5.0)
ALK PHOS: 77 U/L (ref 38–126)
ALT: 29 U/L (ref 14–54)
AST: 33 U/L (ref 15–41)
Anion gap: 8 (ref 5–15)
BUN: 15 mg/dL (ref 6–20)
CALCIUM: 8.7 mg/dL — AB (ref 8.9–10.3)
CO2: 25 mmol/L (ref 22–32)
CREATININE: 0.92 mg/dL (ref 0.44–1.00)
Chloride: 106 mmol/L (ref 101–111)
GFR calc Af Amer: >60 mL/min (ref >60)
GFR calc non Af Amer: >60 mL/min (ref >60)
GLUCOSE: 112 mg/dL — AB (ref 65–99)
Potassium: 3.4 mmol/L — ABNORMAL LOW (ref 3.5–5.1)
SODIUM: 139 mmol/L (ref 135–145)
Total Bilirubin: 0.8 mg/dL (ref 0.3–1.2)
Total Protein: 6.8 g/dL (ref 6.5–8.1)

## 2017-09-05 LAB — CBC WITH DIFFERENTIAL/PLATELET
Basophils Absolute: 0 10*3/uL (ref 0.0–0.1)
Basophils Relative: 1 %
EOS ABS: 0.2 10*3/uL (ref 0.0–0.7)
Eosinophils Relative: 4 %
HEMATOCRIT: 33.6 % — AB (ref 36.0–46.0)
HEMOGLOBIN: 10.6 g/dL — AB (ref 12.0–15.0)
LYMPHS ABS: 1.9 10*3/uL (ref 0.7–4.0)
Lymphocytes Relative: 37 %
MCH: 23.9 pg — ABNORMAL LOW (ref 26.0–34.0)
MCHC: 31.5 g/dL (ref 30.0–36.0)
MCV: 75.7 fL — ABNORMAL LOW (ref 78.0–100.0)
Monocytes Absolute: 0.2 10*3/uL (ref 0.1–1.0)
Monocytes Relative: 4 %
NEUTROS ABS: 2.8 10*3/uL (ref 1.7–7.7)
NEUTROS PCT: 54 %
Platelets: 230 10*3/uL (ref 150–400)
RBC: 4.44 MIL/uL (ref 3.87–5.11)
RDW: 15 % (ref 11.5–15.5)
WBC: 5.2 10*3/uL (ref 4.0–10.5)

## 2017-09-05 LAB — LIPASE, BLOOD: Lipase: 26 U/L (ref 11–51)

## 2017-09-05 MED ORDER — CYCLOBENZAPRINE HCL 10 MG PO TABS
10.0000 mg | ORAL_TABLET | Freq: Once | ORAL | Status: AC
  Administered 2017-09-05: 10 mg via ORAL
  Filled 2017-09-05: qty 1

## 2017-09-05 MED ORDER — CYCLOBENZAPRINE HCL 10 MG PO TABS: 10.0000 mg | ORAL_TABLET | Freq: Two times a day (BID) | ORAL | 0 refills | Status: DC | PRN

## 2017-09-05 MED ORDER — KETOROLAC TROMETHAMINE 15 MG/ML IJ SOLN
15.0000 mg | Freq: Once | INTRAMUSCULAR | Status: AC
  Administered 2017-09-05: 15 mg via INTRAVENOUS
  Filled 2017-09-05: qty 1

## 2017-09-05 MED ORDER — SODIUM CHLORIDE 0.9 % IV BOLUS (SEPSIS)
1000.0000 mL | Freq: Once | INTRAVENOUS | Status: AC
  Administered 2017-09-05: 1000 mL via INTRAVENOUS

## 2017-09-05 MED ORDER — IOPAMIDOL (ISOVUE-300) INJECTION 61%
INTRAVENOUS | Status: AC
  Administered 2017-09-05: 100 mL
  Filled 2017-09-05: qty 100
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
## 2017-09-13 ENCOUNTER — Emergency Department (HOSPITAL_COMMUNITY): Payer: No Typology Code available for payment source

## 2017-09-13 ENCOUNTER — Other Ambulatory Visit: Payer: Self-pay

## 2017-09-13 ENCOUNTER — Emergency Department (HOSPITAL_COMMUNITY)
Admission: EM | Admit: 2017-09-13 | Discharge: 2017-09-13 | Disposition: A | Discharge status: Home / Self Care | Payer: No Typology Code available for payment source | Attending: Emergency Medicine | Admitting: Emergency Medicine

## 2017-09-13 ENCOUNTER — Encounter (HOSPITAL_COMMUNITY): Payer: Self-pay | Admitting: *Deleted

## 2017-09-13 DIAGNOSIS — M545 Low back pain, unspecified: Secondary | ICD-10-CM

## 2017-09-13 DIAGNOSIS — G8929 Other chronic pain: Secondary | ICD-10-CM | POA: Insufficient documentation

## 2017-09-13 DIAGNOSIS — E039 Hypothyroidism, unspecified: Secondary | ICD-10-CM | POA: Diagnosis not present

## 2017-09-13 DIAGNOSIS — Z79899 Other long term (current) drug therapy: Secondary | ICD-10-CM | POA: Diagnosis not present

## 2017-09-13 DIAGNOSIS — M25561 Pain in right knee: Secondary | ICD-10-CM

## 2017-09-13 DIAGNOSIS — M25562 Pain in left knee: Secondary | ICD-10-CM | POA: Diagnosis not present

## 2017-09-13 DIAGNOSIS — Z9049 Acquired absence of other specified parts of digestive tract: Secondary | ICD-10-CM | POA: Insufficient documentation

## 2017-09-13 DIAGNOSIS — E119 Type 2 diabetes mellitus without complications: Secondary | ICD-10-CM | POA: Insufficient documentation

## 2017-09-13 DIAGNOSIS — I10 Essential (primary) hypertension: Secondary | ICD-10-CM | POA: Diagnosis not present

## 2017-09-13 MED ORDER — ACETAMINOPHEN 500 MG PO TABS
1000.0000 mg | ORAL_TABLET | Freq: Once | ORAL | Status: AC
  Administered 2017-09-13: 1000 mg via ORAL
  Filled 2017-09-13: qty 2

## 2017-09-13 MED ORDER — METHOCARBAMOL 500 MG PO TABS: 500.0000 mg | ORAL_TABLET | Freq: Every evening | ORAL | 0 refills | Status: DC | PRN

## 2017-09-13 MED ORDER — ACETAMINOPHEN 500 MG PO TABS: 1000.0000 mg | ORAL_TABLET | Freq: Four times a day (QID) | ORAL | 0 refills | Status: DC | PRN

## 2017-09-13 MED ORDER — DICLOFENAC SODIUM 1 % TD GEL: 2.0000 g | Freq: Four times a day (QID) | TRANSDERMAL | 0 refills | Status: DC
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
## 2017-09-13 NOTE — Discharge Instructions (Addendum)
As discussed, your xray did not show a fracture or dislocation today. Follow the RICE protocol outlined in these instructions. Take tylenol as needed for pain. Do not exceed 4000 mg of acetaminophen containing products in a 24-hour period. The medicine prescribed can help with muscle spasm but cannot be taken if driving, with alcohol or operating machinery.   Follow up with your Primary care provider this week.  Return if worsening or new concerning symptoms in the meantime.
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
## 2020-12-12 ENCOUNTER — Encounter: Payer: Self-pay | Admitting: Podiatry

## 2020-12-12 ENCOUNTER — Ambulatory Visit (INDEPENDENT_AMBULATORY_CARE_PROVIDER_SITE_OTHER): Payer: Medicare Other

## 2020-12-12 ENCOUNTER — Other Ambulatory Visit: Payer: Self-pay

## 2020-12-12 ENCOUNTER — Ambulatory Visit (INDEPENDENT_AMBULATORY_CARE_PROVIDER_SITE_OTHER): Payer: Medicare Other | Admitting: Podiatry

## 2020-12-12 DIAGNOSIS — G629 Polyneuropathy, unspecified: Secondary | ICD-10-CM | POA: Diagnosis not present

## 2020-12-12 DIAGNOSIS — M722 Plantar fascial fibromatosis: Secondary | ICD-10-CM

## 2020-12-12 MED ORDER — TRIAMCINOLONE ACETONIDE 10 MG/ML IJ SUSP
20.0000 mg | Freq: Once | INTRAMUSCULAR | Status: AC
  Administered 2020-12-12: 20 mg

## 2020-12-12 MED ORDER — TRIAMCINOLONE ACETONIDE 10 MG/ML IJ SUSP: 10.0000 mg | Freq: Once | INTRAMUSCULAR | Status: DC

## 2020-12-12 NOTE — Patient Instructions (Signed)

## 2020-12-12 NOTE — Progress Notes (Signed)
Subjective:   Patient ID: Marisa Robinson, female   DOB: 65 y.o.   MRN: [id number]   HPI Patient presents stating she has had a lot of pain in her heels and in her arch and burning pain in her feet tingling type pain at times.  Patient states that this is been going on for several years she had had previous injection which gave temporary relief and she is looking to see if we can help her and does not smoke likes to be active   Review of Systems  All other systems reviewed and are negative.      Objective:  Physical Exam Vitals and nursing note reviewed.  Constitutional:      Appearance: She is well-developed.  Pulmonary:     Effort: Pulmonary effort is normal.  Musculoskeletal:        General: Normal range of motion.  Skin:    General: Skin is warm.  Neurological:     Mental Status: She is alert.    Neurovascular status was found to be intact muscle strength was found to be adequate range of motion adequate.  Patient is found to have exquisite discomfort in the plantar aspect of the heel bilateral with inflammation fluid buildup around the medial band that is been tender when pressed.  Patient has no loss of sharp dull vibratory currently     Assessment:  Hopeful that this is acute Planter fasciitis creating the symptoms with the possibility of neuropathic-like symptomatology which could be part of the pathology also     Plan:  H&P x-rays reviewed all conditions discussed.  At this point I went ahead did sterile prep injected the plantar fascial bilateral 3 mg Kenalog 5 mg Xylocaine applied fascial brace to lift the arch of the right and left and discussed possible treatment for neuropathic-like discomfort.  Patient will be seen back to recheck  X-rays indicate that there is spur formation plantar aspect heel bilateral no indication stress fracture arthritis

## 2020-12-26 ENCOUNTER — Ambulatory Visit (INDEPENDENT_AMBULATORY_CARE_PROVIDER_SITE_OTHER): Payer: Non-veteran care | Admitting: Podiatry

## 2020-12-26 ENCOUNTER — Encounter: Payer: Self-pay | Admitting: Podiatry

## 2020-12-26 ENCOUNTER — Other Ambulatory Visit: Payer: Self-pay

## 2020-12-26 DIAGNOSIS — M722 Plantar fascial fibromatosis: Secondary | ICD-10-CM | POA: Diagnosis not present

## 2020-12-26 MED ORDER — TRIAMCINOLONE ACETONIDE 10 MG/ML IJ SUSP
10.0000 mg | Freq: Once | INTRAMUSCULAR | Status: AC
  Administered 2020-12-26: 10 mg

## 2020-12-26 NOTE — Progress Notes (Signed)
Subjective:   Patient ID: Marisa Robinson, female   DOB: 65 y.o.   MRN: [id number]   HPI Patient presents my left heel is doing great my right heel still has a spot that is been sore   ROS      Objective:  Physical Exam  Neurovascular status intact negative Bevelyn Buckles' sign noted patient's right heel shows 1 area of continued discomfort left heel doing very well     Assessment:  Acute plantar fasciitis still present right improved left     Plan:  Sterile prep went ahead reinjected the plantar fascial right 3 mg Kenalog 5 mg Xylocaine advised on anti-inflammatories and continued support and brace usage and reappoint as needed signed this

## 2021-02-02 DIAGNOSIS — M546 Pain in thoracic spine: Secondary | ICD-10-CM | POA: Diagnosis not present

## 2021-02-10 DIAGNOSIS — M546 Pain in thoracic spine: Secondary | ICD-10-CM | POA: Diagnosis not present

## 2021-10-04 ENCOUNTER — Ambulatory Visit (INDEPENDENT_AMBULATORY_CARE_PROVIDER_SITE_OTHER): Payer: Medicare Other | Admitting: Podiatry

## 2021-10-04 ENCOUNTER — Encounter: Payer: Self-pay | Admitting: Podiatry

## 2021-10-04 DIAGNOSIS — M722 Plantar fascial fibromatosis: Secondary | ICD-10-CM | POA: Diagnosis not present

## 2021-10-04 MED ORDER — TRIAMCINOLONE ACETONIDE 10 MG/ML IJ SUSP
20.0000 mg | Freq: Once | INTRAMUSCULAR | Status: AC
  Administered 2021-10-04: 20 mg

## 2021-10-05 NOTE — Progress Notes (Signed)
Subjective:  ? ?Patient ID: Marisa Robinson, female   DOB: 66 y.o.   MRN: [id number]  ? ?HPI ?Patient presents stating she is doing very well after having her injections but has started to develop pain in the last few weeks.  Stated she had around 9 months of relief with the previous medication ? ? ?ROS ? ? ?   ?Objective:  ?Physical Exam  ?Neurovascular status intact with patient found to have exquisite discomfort medial band plantar fascia bilateral at the insertion ? ?   ?Assessment:  ?Reoccurrence of acute plantar fasciitis bilateral that did well for around 9 months ? ?   ?Plan:  ?Reviewed condition sterile prep done and reviewed x-rays and injected the fascia bilateral 3 mg Kenalog 5 mg Xylocaine instructed on shoe gear choices and stretching exercises reappoint as needed ? ?X-rays indicate spur formation plantarly no indication stress fracture or further abnormalities with moderate depression of the arch ?   ? ? ?

## 2022-01-01 ENCOUNTER — Ambulatory Visit (INDEPENDENT_AMBULATORY_CARE_PROVIDER_SITE_OTHER): Payer: Medicare Other | Admitting: Podiatry

## 2022-01-01 ENCOUNTER — Encounter: Payer: Self-pay | Admitting: Podiatry

## 2022-01-01 DIAGNOSIS — M722 Plantar fascial fibromatosis: Secondary | ICD-10-CM

## 2022-01-01 MED ORDER — TRIAMCINOLONE ACETONIDE 10 MG/ML IJ SUSP
10.0000 mg | Freq: Once | INTRAMUSCULAR | Status: AC
  Administered 2022-01-01: 10 mg

## 2022-01-01 NOTE — Progress Notes (Signed)
Subjective:   Patient ID: Marisa Robinson, female   DOB: 66 y.o.   MRN: [id number]   HPI Patient presents still having a lot of pain in the left heel and also developing pain in the outside of the foot from walking differently   ROS      Objective:  Physical Exam  Neurovascular status intact with intense discomfort plantar and a lot of discomfort in the lateral foot and also extending into the Achilles tendon area with significant compensatory gait     Assessment:  Chronic fasciitis left that is not responding so far to conservative treatment     Plan:  Standard prep injected the left insertional point plantar fascia 3 mg Kenalog 5 mg Xylocaine and applied a air fracture walker to completely immobilize.  Gave instructions on utilization of this and the fact that we will take pressure off the foot and the back of the leg and it was fitted properly to her leg today with all instructions given

## 2022-01-22 ENCOUNTER — Ambulatory Visit (INDEPENDENT_AMBULATORY_CARE_PROVIDER_SITE_OTHER): Payer: Medicare Other

## 2022-01-22 ENCOUNTER — Ambulatory Visit (INDEPENDENT_AMBULATORY_CARE_PROVIDER_SITE_OTHER): Payer: Medicare Other | Admitting: Podiatry

## 2022-01-22 DIAGNOSIS — M79672 Pain in left foot: Secondary | ICD-10-CM

## 2022-01-22 DIAGNOSIS — M722 Plantar fascial fibromatosis: Secondary | ICD-10-CM

## 2022-01-22 NOTE — Progress Notes (Signed)
Subjective:   Patient ID: Marisa Robinson, female   DOB: 66 y.o.   MRN: [id number]   HPI Patient states that she is improving with her left foot pain and states that the night splint seems to help and the pain has reduced right around 7580%   ROS      Objective:  Physical Exam  Neurovascular status intact with patient found to have diminished discomfort in the plantar fascial band left at the insertion with only mild discomfort upon deep palpation     Assessment:  Plantar fasciitis left improving with still some discomfort present     Plan:  We have reviewed condition and recommended the continuation of night splint currently and anti-inflammatories shoe gear modification and reappoint to recheck as needed

## 2023-08-19 LAB — VITAMIN D 25 HYDROXY (VIT D DEFICIENCY, FRACTURES): Vit D, 25-Hydroxy: 36.6

## 2023-08-19 LAB — BASIC METABOLIC PANEL WITH GFR
Creatinine: 1.2 — AB (ref 0.5–1.1)
Glucose: 109
Potassium: 4.1 meq/L (ref 3.5–5.1)
Sodium: 141 (ref 137–147)

## 2023-08-19 LAB — LIPID PANEL
Cholesterol: 172 (ref 0–200)
HDL: 52 (ref 35–70)
LDL Cholesterol: 93
Triglycerides: 114 (ref 40–160)

## 2023-08-19 LAB — COMPREHENSIVE METABOLIC PANEL WITH GFR
Calcium: 9.9 (ref 8.7–10.7)
eGFR: 52

## 2023-11-02 ENCOUNTER — Other Ambulatory Visit (HOSPITAL_BASED_OUTPATIENT_CLINIC_OR_DEPARTMENT_OTHER): Payer: Self-pay

## 2024-02-05 ENCOUNTER — Encounter: Payer: Self-pay | Admitting: General Practice

## 2024-02-05 DIAGNOSIS — T7421XA Adult sexual abuse, confirmed, initial encounter: Secondary | ICD-10-CM | POA: Insufficient documentation

## 2024-02-05 DIAGNOSIS — D560 Alpha thalassemia: Secondary | ICD-10-CM | POA: Insufficient documentation

## 2024-02-06 ENCOUNTER — Ambulatory Visit: Admitting: General Practice

## 2024-02-06 ENCOUNTER — Ambulatory Visit: Payer: Self-pay | Admitting: General Practice

## 2024-02-06 ENCOUNTER — Encounter: Payer: Self-pay | Admitting: General Practice

## 2024-02-06 VITALS — BP 136/66 | HR 69 | Temp 98.2°F | Ht 65.5 in | Wt 226.0 lb

## 2024-02-06 DIAGNOSIS — D500 Iron deficiency anemia secondary to blood loss (chronic): Secondary | ICD-10-CM

## 2024-02-06 DIAGNOSIS — E785 Hyperlipidemia, unspecified: Secondary | ICD-10-CM

## 2024-02-06 DIAGNOSIS — I10 Essential (primary) hypertension: Secondary | ICD-10-CM

## 2024-02-06 DIAGNOSIS — R519 Headache, unspecified: Secondary | ICD-10-CM | POA: Diagnosis not present

## 2024-02-06 DIAGNOSIS — K219 Gastro-esophageal reflux disease without esophagitis: Secondary | ICD-10-CM | POA: Diagnosis not present

## 2024-02-06 DIAGNOSIS — F4310 Post-traumatic stress disorder, unspecified: Secondary | ICD-10-CM

## 2024-02-06 DIAGNOSIS — Z7689 Persons encountering health services in other specified circumstances: Secondary | ICD-10-CM | POA: Insufficient documentation

## 2024-02-06 DIAGNOSIS — Z6837 Body mass index (BMI) 37.0-37.9, adult: Secondary | ICD-10-CM | POA: Diagnosis not present

## 2024-02-06 DIAGNOSIS — D560 Alpha thalassemia: Secondary | ICD-10-CM

## 2024-02-06 DIAGNOSIS — Y991 Military activity: Secondary | ICD-10-CM

## 2024-02-06 DIAGNOSIS — E66812 Obesity, class 2: Secondary | ICD-10-CM

## 2024-02-06 DIAGNOSIS — R809 Proteinuria, unspecified: Secondary | ICD-10-CM

## 2024-02-06 DIAGNOSIS — E119 Type 2 diabetes mellitus without complications: Secondary | ICD-10-CM

## 2024-02-06 LAB — CBC
HCT: 37.7 % (ref 36.0–46.0)
Hemoglobin: 11.8 g/dL — ABNORMAL LOW (ref 12.0–15.0)
MCHC: 31.3 g/dL (ref 30.0–36.0)
MCV: 72.6 fl — ABNORMAL LOW (ref 78.0–100.0)
Platelets: 312 K/uL (ref 150.0–400.0)
RBC: 5.19 Mil/uL — ABNORMAL HIGH (ref 3.87–5.11)
RDW: 14.6 % (ref 11.5–15.5)
WBC: 16.5 K/uL — ABNORMAL HIGH (ref 4.0–10.5)

## 2024-02-06 LAB — COMPREHENSIVE METABOLIC PANEL WITH GFR
ALT: 13 U/L (ref 0–35)
AST: 18 U/L (ref 0–37)
Albumin: 4.5 g/dL (ref 3.5–5.2)
Alkaline Phosphatase: 93 U/L (ref 39–117)
BUN: 19 mg/dL (ref 6–23)
CO2: 28 meq/L (ref 19–32)
Calcium: 9.4 mg/dL (ref 8.4–10.5)
Chloride: 102 meq/L (ref 96–112)
Creatinine, Ser: 0.95 mg/dL (ref 0.40–1.20)
GFR: 61.11 mL/min (ref >60.00)
Glucose, Bld: 139 mg/dL — ABNORMAL HIGH (ref 70–99)
Potassium: 3.8 meq/L (ref 3.5–5.1)
Sodium: 140 meq/L (ref 135–145)
Total Bilirubin: 0.6 mg/dL (ref 0.2–1.2)
Total Protein: 7.7 g/dL (ref 6.0–8.3)

## 2024-02-06 LAB — LIPID PANEL
Cholesterol: 197 mg/dL (ref 0–200)
HDL: 60.4 mg/dL (ref >39.00)
LDL Cholesterol: 127 mg/dL — ABNORMAL HIGH (ref 0–99)
NonHDL: 136.61
Total CHOL/HDL Ratio: 3
Triglycerides: 50 mg/dL (ref 0.0–149.0)
VLDL: 10 mg/dL (ref 0.0–40.0)

## 2024-02-06 LAB — MICROALBUMIN / CREATININE URINE RATIO
Creatinine,U: 212.3 mg/dL
Microalb Creat Ratio: 87.2 mg/g — ABNORMAL HIGH (ref 0.0–30.0)
Microalb, Ur: 18.5 mg/dL — ABNORMAL HIGH (ref 0.0–1.9)

## 2024-02-06 LAB — HEMOGLOBIN A1C: Hgb A1c MFr Bld: 7.2 % — ABNORMAL HIGH (ref 4.6–6.5)

## 2024-02-06 LAB — TSH: TSH: 1.88 u[IU]/mL (ref 0.35–5.50)

## 2024-02-06 NOTE — Assessment & Plan Note (Signed)
 Controlled.  Following with therapist at the [facility].  Currently managed on Lexapro 10 mg once daily.

## 2024-02-06 NOTE — Assessment & Plan Note (Addendum)
 Lipid panel pending. Continue Atorvastatin  40 mg once daily.

## 2024-02-06 NOTE — Assessment & Plan Note (Signed)
 At goal today.  Continue Amlodipine 10 mg once daily and hydrochlorothiazide  25 mg once daily.  Cmp pending.

## 2024-02-06 NOTE — Assessment & Plan Note (Signed)
 Following with therapist at the [facility].

## 2024-02-06 NOTE — Assessment & Plan Note (Signed)
 Uncontrolled. No red flags on exam. Discussed at length.   Reviewed MRI from February 2025.  Repeat MRI in 3-4 months. She plans to call and make another appt with neurologist.

## 2024-02-06 NOTE — Patient Instructions (Signed)
 Stop by the lab prior to leaving today. I will notify you of your results once received.   Call and schedule appt with neurologist.   Continue mediations at prescribed.   Follow up in 3 months.  It was a pleasure to meet you today! Please don't hesitate to contact me with any questions. Welcome to [facility]!

## 2024-02-06 NOTE — Assessment & Plan Note (Signed)
 Controlled.   Continue Omeprazole  20 mg once daily.

## 2024-02-06 NOTE — Assessment & Plan Note (Signed)
 Discussed the importance of healthy diet and exercise to affect sustainable weight loss.
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
## 2024-02-06 NOTE — Assessment & Plan Note (Signed)
 Cbc pending.  Due for colonoscopy.

## 2024-02-06 NOTE — Assessment & Plan Note (Signed)
 No concerns today

## 2024-02-06 NOTE — Assessment & Plan Note (Signed)
 EMR reviewed briefly.   Release sent to retreive records from the [facility].

## 2024-02-06 NOTE — Assessment & Plan Note (Addendum)
 Controlled off meds.  Discussed the importance of monitoring diet and exercise.  Hemoglobin A1c pending. Urine ACR pending.

## 2024-02-07 MED ORDER — ATORVASTATIN CALCIUM 80 MG PO TABS: 80.0000 mg | ORAL_TABLET | Freq: Every day | ORAL | 1 refills | Status: DC

## 2024-02-07 MED ORDER — EMPAGLIFLOZIN 10 MG PO TABS
10.0000 mg | ORAL_TABLET | Freq: Every day | ORAL | 0 refills | Status: DC
Start: 2024-02-07 — End: 2024-02-10

## 2024-02-07 MED ORDER — EMPAGLIFLOZIN 10 MG PO TABS: 10.0000 mg | ORAL_TABLET | Freq: Every day | ORAL | 0 refills | Status: DC

## 2024-02-07 NOTE — Addendum Note (Signed)
 Addended by: VINCENTE SHIVERS on: 02/07/2024 12:41 PM   Modules accepted: Orders

## 2024-02-10 ENCOUNTER — Telehealth: Payer: Self-pay | Admitting: General Practice

## 2024-02-10 ENCOUNTER — Telehealth: Payer: Self-pay

## 2024-02-10 DIAGNOSIS — E785 Hyperlipidemia, unspecified: Secondary | ICD-10-CM

## 2024-02-10 DIAGNOSIS — R519 Headache, unspecified: Secondary | ICD-10-CM

## 2024-02-10 DIAGNOSIS — R809 Proteinuria, unspecified: Secondary | ICD-10-CM

## 2024-02-10 DIAGNOSIS — E119 Type 2 diabetes mellitus without complications: Secondary | ICD-10-CM

## 2024-02-10 MED ORDER — EMPAGLIFLOZIN 10 MG PO TABS: 10.0000 mg | ORAL_TABLET | Freq: Every day | ORAL | 0 refills | Status: DC

## 2024-02-10 MED ORDER — ATORVASTATIN CALCIUM 80 MG PO TABS: 80.0000 mg | ORAL_TABLET | Freq: Every day | ORAL | 1 refills | Status: AC

## 2024-02-10 NOTE — Addendum Note (Signed)
 Addended by: TENNIE RAISIN B on: 02/10/2024 04:52 PM   Modules accepted: Orders

## 2024-02-10 NOTE — Telephone Encounter (Signed)
 Patient ask if her prescription can be called into the following pharmacy is getting a runaround from the [facility] and she states she will pay the copay  Prescription Request  02/10/2024  LOV: 02/06/2024  What is the name of the medication or equipment?  atorvastatin  (LIPITOR) 80 MG tablet  empagliflozin  (JARDIANCE ) 10 MG TABS tablet   Have you contacted your pharmacy to request a refill? No   Which pharmacy would you like this sent to?  [facility] [address] [place], [place] [address]  Phone [phone] Fax [phone]  Patient notified that their request is being sent to the clinical staff for review and that they should receive a response within 2 business days.   Please advise at Mobile [phone] (mobile)

## 2024-02-10 NOTE — Telephone Encounter (Signed)
Erxs sent 

## 2024-02-10 NOTE — Addendum Note (Signed)
 Addended by: TENNIE RAISIN B on: 02/10/2024 04:02 PM   Modules accepted: Orders

## 2024-02-10 NOTE — Telephone Encounter (Signed)
 Called and spoke with patient and she would like a 2nd opinion done at [facility] where she requested her referral to go to.

## 2024-02-10 NOTE — Telephone Encounter (Signed)
 Copied from CRM [id number]. Topic: Clinical - Medication Question >> Feb 10, 2024  1:10 PM Marisa Robinson wrote: Reason for CRM: [facility] a [facility] Lipitor and Jardiance . She stated that [facility] stated the rx needed to be approved by Dr. Tonnie. Dr.Jennings wants PCP to fax her the records regarding these medications and she will say whether or not they are needed then prescribe them herself.  Pt stated she does not want to go through that and would rather go through [facility] to prescribe the medication and send it to a different pharmacy.  Please call back [phone]

## 2024-02-10 NOTE — Telephone Encounter (Signed)
 Copied from CRM [id number]. Topic: Referral - Request for Referral >> Feb 10, 2024  9:55 AM Rea BROCKS wrote: Did the patient discuss referral with their provider in the last year? Yes (If No - schedule appointment) (If Yes - send message)  Appointment offered? Yes but patient needs referral for an appointment.   Type of order/referral and detailed reason for visit: Neurologist   Preference of office, provider, location: [facility] Dena FABIENE Hoard, [address], [phone], [phone]  If referral order, have you been seen by this specialty before? Yes, but not at the [facility] location (If Yes, this issue or another issue? When? Where?  Can we respond through MyChart? Yes

## 2024-02-10 NOTE — Telephone Encounter (Signed)
 See other TE note; refills sent to [facility].

## 2024-02-11 NOTE — Addendum Note (Signed)
 Addended by: VINCENTE SHIVERS on: 02/11/2024 08:02 AM   Modules accepted: Orders

## 2024-02-11 NOTE — Telephone Encounter (Unsigned)
 Copied from CRM [id number]. Topic: Clinical - Medication Question >> Feb 11, 2024  1:36 PM Drema MATSU wrote: Medicare wont cover medications until the next open season when she changes pharmacies. Patient stated that [facility] filled medication but Medicare won't cover it because on her plan she chose that she will get her meds from the [facility]. [facility] stated that [facility] and then they will decide and reissue prescriptions through [facility]. She wants to know if there is another way she can get it. If not can the records be sent to [facility].  [facility] Fax: [phone]

## 2024-02-12 NOTE — Telephone Encounter (Unsigned)
 Copied from CRM #[id number]. Topic: General - Other >> Feb 12, 2024 11:14 AM Macario HERO wrote: Reason for CRM: Patient called to leave a message for her provider. Patient sated [facility] called and said that they need the summary from entire visit and not just labs faxed to them. Patient requesting a follow up when completed.

## 2024-02-12 NOTE — Telephone Encounter (Signed)
Records have been faxed and patient notified.

## 2024-02-12 NOTE — Telephone Encounter (Signed)
OV notes faxed  

## 2024-02-14 ENCOUNTER — Ambulatory Visit: Admitting: General Practice

## 2024-02-17 ENCOUNTER — Encounter: Admitting: General Practice

## 2024-02-18 NOTE — Progress Notes (Signed)
 This encounter was created in error - please disregard.

## 2024-04-03 ENCOUNTER — Encounter: Payer: Self-pay | Admitting: *Deleted

## 2024-04-28 ENCOUNTER — Ambulatory Visit

## 2024-04-28 VITALS — BP 126/76 | Ht 65.0 in | Wt 226.6 lb

## 2024-04-28 DIAGNOSIS — Z0000 Encounter for general adult medical examination without abnormal findings: Secondary | ICD-10-CM

## 2024-04-28 NOTE — Progress Notes (Signed)
 Chief Complaint  Patient presents with   Medicare Wellness     Subjective:   Marisa Robinson is a 68 y.o. female who presents for a Medicare Annual Wellness Visit.  Allergies (verified) Patient has no known allergies.   History: Past Medical History:  Diagnosis Date   Anemia    Gallstones    History of kidney stones    Hyperlipidemia    Hypertension    Hypothyroidism    Thyroid  disease    Urinary tract infection    Past Surgical History:  Procedure Laterality Date   CHOLECYSTECTOMY  04/07/2013   CHOLECYSTECTOMY N/A 04/07/2013   Procedure: LAPAROSCOPIC CHOLECYSTECTOMY WITH INTRAOPERATIVE CHOLANGIOGRAM;  Surgeon: Vicenta DELENA Poli, MD;  Location: [facility] OR;  Service: General;  Laterality: N/A;   etopic surgery  1985, 1987   Family History  Problem Relation Age of Onset   Diabetes Father    Heart disease Father    Hypertension Sister    Hypertension Brother    Cancer Neg Hx    Early death Neg Hx    Hyperlipidemia Neg Hx    Kidney disease Neg Hx    Stroke Neg Hx    Colon cancer Neg Hx    Social History   Occupational History   Not on file  Tobacco Use   Smoking status: Never   Smokeless tobacco: Never  Substance and Sexual Activity   Alcohol use: No    Comment: occasional, 2 x per year   Drug use: No   Sexual activity: Not Currently    Birth control/protection: Post-menopausal   Tobacco Counseling Counseling given: Not Answered  SDOH Screenings   Food Insecurity: No Food Insecurity (04/28/2024)  Housing: Low Risk  (04/28/2024)  Transportation Needs: No Transportation Needs (04/28/2024)  Utilities: Not At Risk (04/28/2024)  Depression (PHQ2-9): Low Risk  (04/28/2024)  Financial Resource Strain: Low Risk  (02/05/2024)  Physical Activity: Insufficiently Active (04/28/2024)  Social Connections: Moderately Integrated (04/28/2024)  Stress: No Stress Concern Present (04/28/2024)  Tobacco Use: Low Risk  (04/28/2024)  Health Literacy: Adequate Health Literacy  (04/28/2024)   See flowsheets for full screening details  Depression Screen PHQ 2 & 9 Depression Scale- Over the past 2 weeks, how often have you been bothered by any of the following problems? Little interest or pleasure in doing things: 0 Feeling down, depressed, or hopeless (PHQ Adolescent also includes...irritable): 0 PHQ-2 Total Score: 0 Trouble falling or staying asleep, or sleeping too much: 0 Feeling tired or having little energy: 0 Poor appetite or overeating (PHQ Adolescent also includes...weight loss): 0 Feeling bad about yourself - or that you are a failure or have let yourself or your family down: 0 Trouble concentrating on things, such as reading the newspaper or watching television (PHQ Adolescent also includes...like school work): 0 Moving or speaking so slowly that other people could have noticed. Or the opposite - being so fidgety or restless that you have been moving around a lot more than usual: 0 Thoughts that you would be better off dead, or of hurting yourself in some way: 0 PHQ-9 Total Score: 0     Goals Addressed             This Visit's Progress    I would like to lose weight and stay healthy         Visit info / Clinical Intake: Medicare Wellness Visit Type:: Subsequent Annual Wellness Visit Persons participating in visit:: patient Medicare Wellness Visit Mode:: In-person (required for [facility]) Information given  by:: patient Interpreter Needed?: No Pre-visit prep was completed: yes AWV questionnaire completed by patient prior to visit?: no Living arrangements:: (!) lives alone Patient's Overall Health Status Rating: good Typical amount of pain: none Does pain affect daily life?: no Are you currently prescribed opioids?: no  Dietary Habits and Nutritional Risks How many meals a day?: 2 Eats fruit and vegetables daily?: yes Most meals are obtained by: preparing own meals; eating out In the last 2 weeks, have you had any of the following?:  none Diabetic:: (!) yes Any non-healing wounds?: no How often do you check your BS?: as needed Would you like to be referred to a Nutritionist or for Diabetic Management? : no  Functional Status Activities of Daily Living (to include ambulation/medication): Independent Ambulation: Independent Medication Administration: Independent Home Management: Independent Manage your own finances?: yes Primary transportation is: driving Concerns about vision?: no *vision screening is required for WTM* Concerns about hearing?: (!) yes Uses hearing aids?: (!) yes Hear whispered voice?: (!) no *in-person visit only*  Fall Screening Falls in the past year?: 0 Number of falls in past year: 0 Was there an injury with Fall?: 0 Fall Risk Category Calculator: 0 Patient Fall Risk Level: Low Fall Risk  Fall Risk Patient at Risk for Falls Due to: No Fall Risks Fall risk Follow up: Education provided; Falls prevention discussed  Home and Transportation Safety: All rugs have non-skid backing?: yes All stairs or steps have railings?: N/A, no stairs Grab bars in the bathtub or shower?: yes Have non-skid surface in bathtub or shower?: yes Good home lighting?: yes Regular seat belt use?: yes Hospital stays in the last year:: no  Cognitive Assessment Difficulty concentrating, remembering, or making decisions? : no Will 6CIT or Mini Cog be Completed: yes What year is it?: 0 points What month is it?: 0 points Give patient an address phrase to remember (5 components): [address] [place]  About what time is it?: 0 points Count backwards from 20 to 1: 0 points Say the months of the year in reverse: 0 points Repeat the address phrase from earlier: 2 points 6 CIT Score: 2 points  Reviewed/Updated  Reviewed/Updated: Reviewed All (Medical, Surgical, Family, Medications, Allergies, Care Teams, Patient Goals)       Objective:    Today's Vitals   04/28/24 1432  BP: 126/76  Weight: 226 lb  9.6 oz (102.8 kg)  Height: 5' 5 (1.651 m)   Body mass index is 37.71 kg/m.  Current Medications (verified) Outpatient Encounter Medications as of 04/28/2024  Medication Sig   amLODipine (NORVASC) 10 MG tablet Take 5 mg by mouth.   atorvastatin  (LIPITOR) 80 MG tablet Take 1 tablet (80 mg total) by mouth daily.   Carboxymethylcellulose Sod PF (REFRESH PLUS) 0.5 % SOLN 1 drop.   cetirizine (ZYRTEC) 10 MG tablet Take 10 mg by mouth.   cyclobenzaprine  (FLEXERIL ) 10 MG tablet Take 10 mg by mouth.   empagliflozin  (JARDIANCE ) 10 MG TABS tablet Take 1 tablet (10 mg total) by mouth daily.   escitalopram (LEXAPRO) 10 MG tablet Take 10 mg by mouth.   hydrochlorothiazide  (HYDRODIURIL ) 25 MG tablet Take by mouth.   ipratropium (ATROVENT) 0.03 % nasal spray Place into the nose.   meloxicam (MOBIC) 15 MG tablet Take by mouth.   omeprazole (PRILOSEC) 40 MG capsule Take by mouth.   Melatonin 3 MG CAPS Take 2 capsules by mouth.   No facility-administered encounter medications on file as of 04/28/2024.   Hearing/Vision screen Vision  Screening - Comments:: UTD [facility]/ [facility] Immunizations and Health Maintenance Health Maintenance  Topic Date Due   FOOT EXAM  Never done   OPHTHALMOLOGY EXAM  Never done   Pneumococcal Vaccine: 50+ Years (1 of 2 - PCV) Never done   Mammogram  03/17/2015   Bone Density Scan  Never done   DTaP/Tdap/Td (3 - Td or Tdap) 07/07/2023   Colonoscopy  07/14/2023   Influenza Vaccine  09/01/2024 (Originally 01/03/2024)   COVID-19 Vaccine (5 - 2025-26 season) 02/20/2026 (Originally 02/03/2024)   HEMOGLOBIN A1C  08/05/2024   Diabetic kidney evaluation - eGFR measurement  02/05/2025   Diabetic kidney evaluation - Urine ACR  02/05/2025   Medicare Annual Wellness (AWV)  04/28/2025   Hepatitis C Screening  Completed   Zoster Vaccines- Shingrix  Completed   Meningococcal B Vaccine  Aged Out        Assessment/Plan:  This is a routine wellness examination for Tequita.  Patient  Care Team: Vincente Shivers, NP as PCP - General (General Practice)  I have personally reviewed and noted the following in the patient's chart:   Medical and social history Use of alcohol, tobacco or illicit drugs  Current medications and supplements including opioid prescriptions. Functional ability and status Nutritional status Physical activity Advanced directives List of other physicians Hospitalizations, surgeries, and ER visits in previous 12 months Vitals Screenings to include cognitive, depression, and falls Referrals and appointments  No orders of the defined types were placed in this encounter.  In addition, I have reviewed and discussed with patient certain preventive protocols, quality metrics, and best practice recommendations. A written personalized care plan for preventive services as well as general preventive health recommendations were provided to patient.   Erminio LITTIE Saris, LPN   [id number]/7974   Return in 1 year (on 04/28/2025).  After Visit Summary: (In Person-Printed) AVS printed and given to the patient  Nurse Notes: Pt has no concerns or questions. AWV made for one year/[facility]

## 2024-04-28 NOTE — Addendum Note (Signed)
 Addended by: ESTELLA ERMINIO CROME on: 04/28/2024 03:36 PM   Modules accepted: Level of Service

## 2024-04-28 NOTE — Patient Instructions (Signed)
 Ms. Asmus,  Thank you for taking the time for your Medicare Wellness Visit. I appreciate your continued commitment to your health goals. Please review the care plan we discussed, and feel free to reach out if I can assist you further.  Please note that Annual Wellness Visits do not include a physical exam. Some assessments may be limited, especially if the visit was conducted virtually. If needed, we may recommend an in-person follow-up with your provider.  Ongoing Care Seeing your primary care provider every 3 to 6 months helps us  monitor your health and provide consistent, personalized care.   Referrals If a referral was made during today's visit and you haven't received any updates within two weeks, please contact the referred provider directly to check on the status.  Recommended Screenings:  Health Maintenance  Topic Date Due   Medicare Annual Wellness Visit  Never done   Complete foot exam   Never done   Eye exam for diabetics  Never done   Pneumococcal Vaccine for age over 51 (1 of 2 - PCV) Never done   Breast Cancer Screening  03/17/2015   Osteoporosis screening with Bone Density Scan  Never done   DTaP/Tdap/Td vaccine (3 - Td or Tdap) 07/07/2023   Colon Cancer Screening  07/14/2023   Flu Shot  09/01/2024*   COVID-19 Vaccine (5 - 2025-26 season) 02/20/2026*   Hemoglobin A1C  08/05/2024   Yearly kidney function blood test for diabetes  02/05/2025   Yearly kidney health urinalysis for diabetes  02/05/2025   Hepatitis C Screening  Completed   Zoster (Shingles) Vaccine  Completed   Meningitis B Vaccine  Aged Out  *Topic was postponed. The date shown is not the original due date.       09/18/2015   12:00 PM  Advanced Directives  Does Patient Have a Medical Advance Directive? No      Data saved with a previous flowsheet row definition    Vision: Annual vision screenings are recommended for early detection of glaucoma, cataracts, and diabetic retinopathy. These exams  can also reveal signs of chronic conditions such as diabetes and high blood pressure.  Dental: Annual dental screenings help detect early signs of oral cancer, gum disease, and other conditions linked to overall health, including heart disease and diabetes.

## 2024-05-07 ENCOUNTER — Ambulatory Visit: Admitting: General Practice

## 2024-05-19 ENCOUNTER — Encounter: Payer: Self-pay | Admitting: General Practice

## 2024-05-19 ENCOUNTER — Ambulatory Visit: Admitting: General Practice

## 2024-05-19 VITALS — BP 126/80 | HR 62 | Temp 98.0°F | Ht 65.0 in | Wt 226.0 lb

## 2024-05-19 DIAGNOSIS — F4310 Post-traumatic stress disorder, unspecified: Secondary | ICD-10-CM

## 2024-05-19 DIAGNOSIS — E66812 Obesity, class 2: Secondary | ICD-10-CM | POA: Diagnosis not present

## 2024-05-19 DIAGNOSIS — Z6837 Body mass index (BMI) 37.0-37.9, adult: Secondary | ICD-10-CM

## 2024-05-19 DIAGNOSIS — I10 Essential (primary) hypertension: Secondary | ICD-10-CM

## 2024-05-19 DIAGNOSIS — R809 Proteinuria, unspecified: Secondary | ICD-10-CM

## 2024-05-19 DIAGNOSIS — E785 Hyperlipidemia, unspecified: Secondary | ICD-10-CM

## 2024-05-19 DIAGNOSIS — E119 Type 2 diabetes mellitus without complications: Secondary | ICD-10-CM

## 2024-05-19 DIAGNOSIS — Z7984 Long term (current) use of oral hypoglycemic drugs: Secondary | ICD-10-CM | POA: Diagnosis not present

## 2024-05-19 MED ORDER — EMPAGLIFLOZIN 25 MG PO TABS: 25.0000 mg | ORAL_TABLET | Freq: Every day | ORAL | Status: AC

## 2024-05-19 MED ORDER — ZEPBOUND 2.5 MG/0.5ML ~~LOC~~ SOAJ: 2.5000 mg | SUBCUTANEOUS | Status: AC
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
## 2024-05-19 NOTE — Patient Instructions (Addendum)
 Please bring medication bottles with you to next visit.   Need lab results, need office notes from the [facility].   Follow up with neurology as planned.   Follow up in 3 months.   It was a pleasure to see you today!

## 2024-05-21 ENCOUNTER — Encounter: Payer: Self-pay | Admitting: General Practice

## 2024-05-22 ENCOUNTER — Other Ambulatory Visit: Payer: Self-pay

## 2024-05-22 MED ORDER — FAMOTIDINE 20 MG PO TABS: 20.0000 mg | ORAL_TABLET | Freq: Every day | ORAL | Status: AC

## 2024-05-22 NOTE — Progress Notes (Signed)
 Medication list has been updated.

## 2024-06-22 ENCOUNTER — Encounter: Payer: Self-pay | Admitting: General Practice

## 2024-08-17 ENCOUNTER — Ambulatory Visit: Admitting: General Practice

## 2025-04-30 ENCOUNTER — Ambulatory Visit
